# Patient Record
Sex: Female | Born: 1995 | Race: Black or African American | Hispanic: No | Marital: Single | Smoking: Never smoker
Health system: Southern US, Community
[De-identification: ages and names within clinical notes are randomized; demographics above are authoritative.]

## PROBLEM LIST (undated history)

## (undated) ENCOUNTER — Inpatient Hospital Stay (HOSPITAL_COMMUNITY): Payer: Self-pay

## (undated) ENCOUNTER — Inpatient Hospital Stay: Payer: Self-pay

## (undated) DIAGNOSIS — B951 Streptococcus, group B, as the cause of diseases classified elsewhere: Secondary | ICD-10-CM

## (undated) DIAGNOSIS — O139 Gestational [pregnancy-induced] hypertension without significant proteinuria, unspecified trimester: Secondary | ICD-10-CM

## (undated) HISTORY — PX: NO PAST SURGERIES: SHX2092

---
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
## 2013-03-01 DIAGNOSIS — O139 Gestational [pregnancy-induced] hypertension without significant proteinuria, unspecified trimester: Secondary | ICD-10-CM

## 2013-03-01 HISTORY — DX: Gestational (pregnancy-induced) hypertension without significant proteinuria, unspecified trimester: O13.9

## 2013-06-25 ENCOUNTER — Emergency Department (HOSPITAL_COMMUNITY)
Admission: EM | Admit: 2013-06-25 | Discharge: 2013-06-25 | Disposition: A | Discharge status: Home / Self Care | Payer: Self-pay | Attending: Emergency Medicine | Admitting: Emergency Medicine

## 2013-06-25 ENCOUNTER — Encounter (HOSPITAL_COMMUNITY): Payer: Self-pay | Admitting: Emergency Medicine

## 2013-06-25 DIAGNOSIS — Z3490 Encounter for supervision of normal pregnancy, unspecified, unspecified trimester: Secondary | ICD-10-CM

## 2013-06-25 DIAGNOSIS — O9989 Other specified diseases and conditions complicating pregnancy, childbirth and the puerperium: Secondary | ICD-10-CM | POA: Insufficient documentation

## 2013-06-25 DIAGNOSIS — M545 Low back pain, unspecified: Secondary | ICD-10-CM | POA: Insufficient documentation

## 2013-06-25 LAB — POC URINE PREG, ED: Preg Test, Ur: POSITIVE — AB

## 2013-06-25 MED ORDER — PRENATAL COMPLETE 14-0.4 MG PO TABS: 1.0000 | ORAL_TABLET | Freq: Every day | ORAL | Status: DC

## 2013-06-25 NOTE — ED Notes (Addendum)
pts and family members laughing and joking loudly, I told patient and family I would close the door so that they could continue speaking without interrupting other patients. Family opened the door right after and continued.  Told family " I do not mean to be rude but that I can hear them laughing and joking from my desk, and if it was possible to close the door or keep it down."  Pts mother told me "Sorry we are phobic, and that we are waiting on paper work and then we will leave, so i could chart that."

## 2013-06-25 NOTE — Discharge Instructions (Signed)
Take prenatal vitamins daily. Make an appointment at the recommended OBGYN clinic. Refer to attached documents for more information. Go to [facility] with worsening or concerning symptoms.

## 2013-06-25 NOTE — ED Provider Notes (Signed)
Medical screening examination/treatment/procedure(s) were performed by non-physician practitioner and as supervising physician I was immediately available for consultation/collaboration.   EKG Interpretation None       Juliet RudeNathan R. Rubin PayorPickering, MD 06/25/13 1540

## 2013-06-25 NOTE — ED Provider Notes (Signed)
CSN: [id number]     Arrival date & time 06/25/13  [id number] History   First MD Initiated Contact with Patient 06/25/13 [phone]     Chief Complaint  Patient presents with  . Back Pain     (Consider location/radiation/quality/duration/timing/severity/associated sxs/prior Treatment) HPI Comments: Patient is a 18 year old female who presents with gradual onset of lower back pain that started 1 month ago. The pain is aching and moderate and does not radiate. The pain is constant. Movement makes the pain worse. Nothing makes the pain better. Patient has not tried anything for pain. No associated symptoms. No saddles paresthesias or bladder/bowel incontinence. Patient reports LMP was 3 months ago. She is sexually active and does not use protection.     Patient is a 18 y.o. female presenting with back pain.  Back Pain Associated symptoms: no abdominal pain, no chest pain, no dysuria, no fever and no weakness     History reviewed. No pertinent past medical history. History reviewed. No pertinent past surgical history. No family history on file. History  Substance Use Topics  . Smoking status: Never Smoker   . Smokeless tobacco: Not on file  . Alcohol Use: No   OB History   Grav Para Term Preterm Abortions TAB SAB Ect Mult Living   1              Review of Systems  Constitutional: Negative for fever, chills and fatigue.  HENT: Negative for trouble swallowing.   Eyes: Negative for visual disturbance.  Respiratory: Negative for shortness of breath.   Cardiovascular: Negative for chest pain and palpitations.  Gastrointestinal: Negative for nausea, vomiting, abdominal pain and diarrhea.  Genitourinary: Negative for dysuria and difficulty urinating.  Musculoskeletal: Positive for back pain. Negative for arthralgias and neck pain.  Skin: Negative for color change.  Neurological: Negative for dizziness and weakness.  Psychiatric/Behavioral: Negative for dysphoric mood.      Allergies  Review  of patient's allergies indicates not on file.  Home Medications   Prior to Admission medications   Not on File   BP 131/81  Pulse 84  Temp(Src) 99 F (37.2 C) (Oral)  Resp 16  SpO2 100%  LMP 04/27/2013 Physical Exam  Nursing note and vitals reviewed. Constitutional: She is oriented to person, place, and time. She appears well-developed and well-nourished. No distress.  HENT:  Head: Normocephalic and atraumatic.  Eyes: Conjunctivae and EOM are normal.  Neck: Normal range of motion.  Cardiovascular: Normal rate and regular rhythm.  Exam reveals no gallop and no friction rub.   No murmur heard. Pulmonary/Chest: Effort normal and breath sounds normal. She has no wheezes. She has no rales. She exhibits no tenderness.  Abdominal: Soft. She exhibits no distension. There is no tenderness. There is no rebound.  Musculoskeletal: Normal range of motion.  Neurological: She is alert and oriented to person, place, and time. Coordination normal.  Speech is goal-oriented. Moves limbs without ataxia.   Skin: Skin is warm and dry.  Psychiatric: She has a normal mood and affect. Her behavior is normal.    ED Course  Procedures (including critical care time) Labs Review Labs Reviewed  POC URINE PREG, ED - Abnormal; Notable for the following:    Preg Test, Ur POSITIVE (*)    All other components within normal limits    Imaging Review No results found.   EKG Interpretation None      MDM   Final diagnoses:  Pregnancy    10:45 AM Patient's  urine pregnancy test is positive. Patient likely having musculoskeletal back pain. No injury. Symptoms have been going on for 1 month. Patient has no urinary complaints. Vitals stable and patient afebrile. No vaginal bleeding and no abdominal pain. Patient will be discharged with prenatal vitamins and instructions to follow up with an obgyn. Patient is ambulating without difficulty. She is laughing and talkative in the room. She appears non toxic. I  am not concerned about an ectopic pregnancy at this time. Patient [facility] with worsening or concerning symptoms.     Emilia BeckKaitlyn Terelle Dobler, PA-C 06/25/13 1054

## 2013-06-25 NOTE — Progress Notes (Signed)
[facility] provided pt with a list of self-pay PCP to help patient establish primary care.  °

## 2013-06-25 NOTE — ED Notes (Addendum)
Per pt, states lower back pain for about a month-no dysuria-states she thinks she is pregnant-took preg test and it said it was positive-no prenatal care

## 2013-08-17 ENCOUNTER — Ambulatory Visit: Payer: Self-pay | Admitting: Advanced Practice Midwife

## 2013-08-19 ENCOUNTER — Emergency Department: Payer: Self-pay | Admitting: Emergency Medicine

## 2013-08-19 LAB — URINALYSIS, COMPLETE
BILIRUBIN, UR: NEGATIVE
Blood: NEGATIVE
Glucose,UR: NEGATIVE mg/dL (ref 0–75)
Nitrite: NEGATIVE
Ph: 6 (ref 4.5–8.0)
Protein: 30
RBC,UR: 3 /HPF (ref 0–5)
Specific Gravity: 1.023 (ref 1.003–1.030)
Squamous Epithelial: 28

## 2013-08-19 LAB — BASIC METABOLIC PANEL
Anion Gap: 10 (ref 7–16)
BUN: 8 mg/dL (ref 7–18)
CALCIUM: 9.3 mg/dL (ref 8.5–10.1)
CO2: 23 mmol/L (ref 21–32)
CREATININE: 0.7 mg/dL (ref 0.60–1.30)
Chloride: 101 mmol/L (ref 98–107)
EGFR (African American): >60
Glucose: 67 mg/dL (ref 65–99)
OSMOLALITY: 265 (ref 275–301)
POTASSIUM: 3.3 mmol/L — AB (ref 3.5–5.1)
Sodium: 134 mmol/L — ABNORMAL LOW (ref 136–145)

## 2013-08-19 LAB — CBC WITH DIFFERENTIAL/PLATELET
Comment - H1-Com1: NORMAL
Comment - H1-Com2: NORMAL
HCT: 33.2 % — AB (ref 35.0–47.0)
HGB: 10.9 g/dL — AB (ref 12.0–16.0)
LYMPHS PCT: 17 %
MCH: 28 pg (ref 26.0–34.0)
MCHC: 32.7 g/dL (ref 32.0–36.0)
MCV: 85 fL (ref 80–100)
MONOS PCT: 7 %
Platelet: 321 10*3/uL (ref 150–440)
RBC: 3.88 10*6/uL (ref 3.80–5.20)
RDW: 14.1 % (ref 11.5–14.5)
Segmented Neutrophils: 76 %
WBC: 24.2 10*3/uL — ABNORMAL HIGH (ref 3.6–11.0)

## 2013-08-19 LAB — GC/CHLAMYDIA PROBE AMP

## 2013-08-19 LAB — WET PREP, GENITAL

## 2013-08-22 LAB — BETA STREP CULTURE(ARMC)

## 2013-08-24 LAB — CULTURE, BLOOD (SINGLE)

## 2013-08-27 ENCOUNTER — Encounter: Payer: Self-pay | Admitting: Maternal & Fetal Medicine

## 2013-12-31 ENCOUNTER — Encounter (HOSPITAL_COMMUNITY): Payer: Self-pay | Admitting: Emergency Medicine

## 2014-01-02 ENCOUNTER — Inpatient Hospital Stay: Payer: Self-pay | Admitting: Obstetrics and Gynecology

## 2014-01-02 LAB — PIH PROFILE
Anion Gap: 8 (ref 7–16)
BUN: 6 mg/dL — ABNORMAL LOW (ref 7–18)
Calcium, Total: 8.5 mg/dL (ref 8.5–10.1)
Chloride: 106 mmol/L (ref 98–107)
Co2: 27 mmol/L (ref 21–32)
Creatinine: 0.61 mg/dL (ref 0.60–1.30)
EGFR (African American): >60
EGFR (Non-African Amer.): >60
Glucose: 79 mg/dL (ref 65–99)
HCT: 30.7 % — ABNORMAL LOW (ref 35.0–47.0)
HGB: 10.3 g/dL — ABNORMAL LOW (ref 12.0–16.0)
MCH: 28.8 pg (ref 26.0–34.0)
MCHC: 33.7 g/dL (ref 32.0–36.0)
MCV: 85 fL (ref 80–100)
Osmolality: 278 (ref 275–301)
Platelet: 364 10*3/uL (ref 150–440)
Potassium: 3.2 mmol/L — ABNORMAL LOW (ref 3.5–5.1)
RBC: 3.59 10*6/uL — ABNORMAL LOW (ref 3.80–5.20)
RDW: 13.6 % (ref 11.5–14.5)
SGOT(AST): 23 U/L (ref 15–37)
Sodium: 141 mmol/L (ref 136–145)
Uric Acid: 3.3 mg/dL (ref 2.6–6.0)
WBC: 14.2 10*3/uL — ABNORMAL HIGH (ref 3.6–11.0)

## 2014-01-02 LAB — DRUG SCREEN, URINE

## 2014-01-02 LAB — PROTEIN / CREATININE RATIO, URINE
Creatinine, Urine: 150.4 mg/dL — ABNORMAL HIGH (ref 30.0–125.0)
Protein, Random Urine: 32 mg/dL — ABNORMAL HIGH (ref 0–12)

## 2014-01-02 LAB — PLATELET COUNT: Platelet: 325 10*3/uL (ref 150–440)

## 2014-01-04 LAB — HEMATOCRIT: HCT: 22.9 % — AB (ref 35.0–47.0)

## 2014-07-09 NOTE — H&P (Signed)
L&D Evaluation:  History:  HPI 19 yo G1 at 29w5dby 19wk [place] 01/11/14 presenting via EMS for vaginal spotting.  Passed quarter size clot at home, no inciting event such as intercourse, rrecent cervical checks, or abdominal trauma.  Was last seen at [facility] on 12/31/13.  Placentation anterior on prior ultrasound without concern for previa.  She was noted to be hypertensive on presentation without setting of prior hypertension.  She denies HA. vision changes, RUQ or epigastric pain, increased edema, or LOF.  She reports intermitten contractions, not particularly painfull some occasional tightening.  +FM.  PNC at [facility] noteable for late entry to care.  She had a prior negative UDS on 08/14/13 (prior report of marijuana use).  There is questionable agenesis or the right kidney and echogenic focus in the left ventricle noted on initial anatomy ultrasound but follow up ultrasound by DP was normal.   Presents with vaginal bleeding   Patient's Medical History Depression   Patient's Surgical History none   Medications Pre Natal Vitamins   Allergies NKDA, strawberries   Social History none   Family History Non-Contributory   ROS:  ROS All systems were reviewed.  HEENT, CNS, GI, GU, Respiratory, CV, Renal and Musculoskeletal systems were found to be normal.   Exam:  Vital Signs BP >140/90  143/100, 154/115, 145/67   Urine Protein P/C ratio pending   General no apparent distress   Mental Status clear   Chest clear   Abdomen gravid, non-tender   Estimated Fetal Weight Average for gestational age   Edema no edema   Pelvic no external lesions, 2.5/70/-3 minimal bright red blood on glove with check   Mebranes Intact   FHT normal rate with no decels, 145, moderate, +accels, no decels   Ucx irregular   Skin dry, no lesions   Lymph no lymphadenopathy   Impression:  Impression 19yo G1 at 362w5dith elevated BP, initial complaint spottiing   Plan:  Plan EFM/NST,  monitor contractions and for cervical change, monitor BP, PIH panel   Comments 1) Spotting - no abdominal pain, reassuring fetal surveillance low clinical concern for abruption.  Will monitor, may be secondary to cervical change as she is contracting irregularly.  Rh positive  2) Fetus - category I tracing  3) O pos / ABSC neg / RI by MMR x 2 / HBsAg neg / HIV neg VZNI / Hgb AA / no report of 1-hr / GBS bacteruria this pregnancy - varivax postpartum  4) Will admit for IOL for GHTN vs PreE at term   Electronic Signatures for Addendum Section:  StDorthula NettlesMD) (Signed Addendum [id number])  P/C ratio is 212, remainder of PIH labs are normal.  BP remain mild range.  Will plan on admission and cytotec induction for gestational hypertension.  At present no neuroligc symptoms, negative protein, and mild range BPs.  Will monitor for severe range BP or developemnt of neuroligc symptoms   Electronic Signatures: StDorthula NettlesMD)  (Signed [id number])  Authored: L&D Evaluation   Last Updated: 04-Nov-15 06:19 by StDorthula NettlesMD)

## 2014-09-26 ENCOUNTER — Other Ambulatory Visit: Payer: Self-pay | Admitting: Physician Assistant

## 2014-09-26 DIAGNOSIS — Z369 Encounter for antenatal screening, unspecified: Secondary | ICD-10-CM

## 2014-09-27 LAB — OB RESULTS CONSOLE RPR: RPR: NONREACTIVE

## 2014-09-27 LAB — OB RESULTS CONSOLE ABO/RH: RH Type: POSITIVE

## 2014-09-27 LAB — OB RESULTS CONSOLE HEPATITIS B SURFACE ANTIGEN: Hepatitis B Surface Ag: NEGATIVE

## 2014-09-27 LAB — OB RESULTS CONSOLE HIV ANTIBODY (ROUTINE TESTING): HIV: NONREACTIVE

## 2014-09-27 LAB — OB RESULTS CONSOLE ANTIBODY SCREEN: ANTIBODY SCREEN: NEGATIVE

## 2014-09-29 LAB — OB RESULTS CONSOLE GC/CHLAMYDIA: Gonorrhea: NEGATIVE

## 2014-10-01 LAB — OB RESULTS CONSOLE GC/CHLAMYDIA: Chlamydia: POSITIVE

## 2014-10-17 ENCOUNTER — Ambulatory Visit: Admission: RE | Admit: 2014-10-17 | Payer: Self-pay | Source: Ambulatory Visit

## 2014-10-24 ENCOUNTER — Ambulatory Visit
Admission: RE | Admit: 2014-10-24 | Discharge: 2014-10-24 | Disposition: A | Discharge status: Home / Self Care | Payer: Self-pay | Source: Ambulatory Visit | Attending: Obstetrics and Gynecology | Admitting: Obstetrics and Gynecology

## 2014-10-24 ENCOUNTER — Ambulatory Visit
Admission: RE | Admit: 2014-10-24 | Discharge: 2014-10-24 | Disposition: A | Discharge status: Home / Self Care | Payer: Self-pay | Source: Ambulatory Visit | Attending: Physician Assistant | Admitting: Physician Assistant

## 2014-10-24 VITALS — BP 116/74 | HR 92 | Temp 98.5°F | Ht 61.0 in | Wt 147.6 lb

## 2014-10-24 DIAGNOSIS — Z369 Encounter for antenatal screening, unspecified: Secondary | ICD-10-CM

## 2014-10-24 DIAGNOSIS — Z1379 Encounter for other screening for genetic and chromosomal anomalies: Secondary | ICD-10-CM | POA: Insufficient documentation

## 2014-10-24 DIAGNOSIS — Z3A15 15 weeks gestation of pregnancy: Secondary | ICD-10-CM

## 2014-10-24 DIAGNOSIS — Z1389 Encounter for screening for other disorder: Secondary | ICD-10-CM

## 2014-10-24 DIAGNOSIS — Z36 Encounter for antenatal screening of mother: Secondary | ICD-10-CM | POA: Insufficient documentation

## 2014-10-24 HISTORY — DX: Gestational (pregnancy-induced) hypertension without significant proteinuria, unspecified trimester: O13.9

## 2014-10-24 NOTE — Progress Notes (Addendum)
Referring physician:  [facility] Department Length of Consultation: 30 minutes   Colleen Shaw  was referred to [facility] for genetic counseling to review prenatal screening and testing options.  This note summarizes the information we discussed.    The following routine screening tests are available in this pregnancy:  First trimester screening, which can include nuchal translucency ultrasound screen and/or first trimester maternal serum marker screening.  The nuchal translucency has approximately an 80% detection rate for Down syndrome and can be positive for other chromosome abnormalities as well as congenital heart defects.  When combined with a maternal serum marker screening, the detection rate is up to 90% for Down syndrome and up to 97% for trisomy 18.     Maternal serum marker screening, a blood test that measures pregnancy proteins, can provide risk assessments for Down syndrome, trisomy 18, and open neural tube defects (spina bifida, anencephaly). Because it does not directly examine the fetus, it cannot positively diagnose or rule out these problems.  Targeted ultrasound uses high frequency sound waves to create an image of the developing fetus.  An ultrasound is often recommended as a routine means of evaluating the pregnancy.  It is also used to screen for fetal anatomy problems (for example, a heart defect) that might be suggestive of a chromosomal or other abnormality.   Should these screening tests indicate an increased concern, then the following additional testing options would be offered:  The chorionic villus sampling procedure is available for first trimester chromosome analysis.  This involves the withdrawal of a small amount of chorionic villi (tissue from the developing placenta).  Risk of pregnancy loss is estimated to be approximately 1 in 200 to 1 in 100 (0.5 to 1%).  There is approximately a 1% (1 in 100) chance that the CVS chromosome  results will be unclear.  Chorionic villi cannot be tested for neural tube defects.     Amniocentesis involves the removal of a small amount of amniotic fluid from the sac surrounding the fetus with the use of a thin needle inserted through the maternal abdomen and uterus.  Ultrasound guidance is used throughout the procedure.  Fetal cells from amniotic fluid are directly evaluated and > 99.5% of chromosome problems and > 98% of open neural tube defects can be detected. This procedure is generally performed after the 15th week of pregnancy.  The main risks to this procedure include complications leading to miscarriage in less than 1 in 200 cases (0.5%).  As another option for information if the pregnancy is suspected to be an an increased chance for certain chromosome conditions, we also reviewed the availability of cell free fetal DNA testing from maternal blood to determine whether or not the baby may have either Down syndrome, trisomy 74, or trisomy 70.  This test utilizes a maternal blood sample and DNA sequencing technology to isolate circulating cell free fetal DNA from maternal plasma.  The fetal DNA can then be analyzed for DNA sequences that are derived from the three most common chromosomes involved in aneuploidy, chromosomes 13, 18, and 21.  If the overall amount of DNA is greater than the expected level for any of these chromosomes, aneuploidy is suspected.  While we do not consider it a replacement for invasive testing and karyotype analysis, a negative result from this testing would be reassuring, though not a guarantee of a normal chromosome complement for the baby.  An abnormal result is certainly suggestive of an abnormal chromosome  complement, though we would still recommend CVS or amniocentesis to confirm any findings from this testing.  We obtained a detailed family history and pregnancy history.  The remainder of the family history was reported to be unremarkable for birth defects, mental  retardation, recurrent pregnancy loss or known chromosome abnormalities.  Colleen Shaw stated that this is her second pregnancy. She and her partner have a healthy 27 month old daughter.  She reported no complications or exposures in the current pregnancy that would be expected to increase the risk for birth defects.  After consideration of the options, Colleen Shaw elected to proceed with first trimester screening.  An ultrasound was performed at the time of the visit. Fetal anatomy could not be assessed due to early gestational age.  Please refer to the ultrasound report for details of that study including gestational age and NT measurement.  Colleen Shaw was encouraged to call with questions or concerns.  We can be contacted at [phone].    I saw the pt and agree with the assessment in the genetic counselors note  Brantley Fling MD   Addendum:  Ultrasound revealed the patient to be [redacted] weeks gestation.  Therefore, she was too far along for the first trimester screening to be performed.  She desired to have maternal serum screening drawn at her next OB visit, as documented on the ultrasound report.

## 2014-10-31 NOTE — Addendum Note (Signed)
Encounter addended by: Katrina Stack on: 10/31/2014 12:05 PM<BR>     Documentation filed: Notes Section

## 2014-11-13 ENCOUNTER — Other Ambulatory Visit: Payer: Self-pay

## 2014-11-13 DIAGNOSIS — O359XX0 Maternal care for (suspected) fetal abnormality and damage, unspecified, not applicable or unspecified: Secondary | ICD-10-CM

## 2014-11-14 ENCOUNTER — Ambulatory Visit
Admission: RE | Admit: 2014-11-14 | Discharge: 2014-11-14 | Disposition: A | Discharge status: Home / Self Care | Payer: Self-pay | Source: Ambulatory Visit | Attending: Obstetrics & Gynecology | Admitting: Obstetrics & Gynecology

## 2014-11-14 VITALS — BP 118/67 | HR 82 | Temp 98.4°F | Resp 18 | Ht 61.2 in | Wt 148.0 lb

## 2014-11-14 DIAGNOSIS — O359XX0 Maternal care for (suspected) fetal abnormality and damage, unspecified, not applicable or unspecified: Secondary | ICD-10-CM

## 2014-11-14 DIAGNOSIS — Z3A18 18 weeks gestation of pregnancy: Secondary | ICD-10-CM | POA: Insufficient documentation

## 2014-11-14 DIAGNOSIS — O283 Abnormal ultrasonic finding on antenatal screening of mother: Secondary | ICD-10-CM | POA: Insufficient documentation

## 2014-11-14 NOTE — Progress Notes (Addendum)
[facility]: Pt presented for anatomy scan. She had to leave before we could complete the study or review the results. There is an EIF present.  I called her to review on the phone.  She declines cell-free fetal DNA testing or amniocentesis at this time but would like to proceed with multiple marker testing with the [facility]  -Pt will call the [facility] tomorrow to schedule multiple marker testing. -Return in four weeks for [place] to complete anatomy  Har[place] Paster, [place] A, MD

## 2014-12-02 ENCOUNTER — Ambulatory Visit: Admission: RE | Admit: 2014-12-02 | Payer: Medicaid Other | Source: Ambulatory Visit

## 2015-03-02 NOTE — L&D Delivery Note (Signed)
Delivery Note At 3:50 PM a viable female was delivered via Vaginal, Spontaneous Delivery (Presentation: Left Occiput Anterior).  APGAR: 9, 9; weight 6 lb 1 oz (2750 g).   Placenta status: Intact, Spontaneous.  Cord: 3 vessels with the following complications: None.  Cord pH: NA  Anesthesia: Epidural  Episiotomy: None Lacerations: None Suture Repair: NA Est. Blood Loss (mL): 100  Mom to postpartum.  Baby to Couplet care / Skin to Skin. Placenta to: BS Feeding: Bottle Circ: NA Contraception: IUD   [place], Koleen Nimrod 04/12/2015, 5:08 PM

## 2015-03-24 ENCOUNTER — Encounter: Payer: Self-pay | Admitting: Family Medicine

## 2015-03-24 ENCOUNTER — Encounter: Payer: Medicaid Other | Admitting: Family Medicine

## 2015-04-10 ENCOUNTER — Encounter: Payer: Self-pay | Admitting: Obstetrics and Gynecology

## 2015-04-12 ENCOUNTER — Inpatient Hospital Stay (HOSPITAL_COMMUNITY): Payer: Medicaid Other | Admitting: Anesthesiology

## 2015-04-12 ENCOUNTER — Inpatient Hospital Stay (HOSPITAL_COMMUNITY)
Admission: AD | Admit: 2015-04-12 | Discharge: 2015-04-14 | DRG: 774 | Disposition: A | Discharge status: Home / Self Care | Payer: Medicaid Other | Source: Ambulatory Visit | Attending: Family Medicine | Admitting: Family Medicine

## 2015-04-12 ENCOUNTER — Encounter (HOSPITAL_COMMUNITY): Payer: Self-pay

## 2015-04-12 DIAGNOSIS — F329 Major depressive disorder, single episode, unspecified: Secondary | ICD-10-CM | POA: Diagnosis present

## 2015-04-12 DIAGNOSIS — IMO0001 Reserved for inherently not codable concepts without codable children: Secondary | ICD-10-CM

## 2015-04-12 DIAGNOSIS — O283 Abnormal ultrasonic finding on antenatal screening of mother: Secondary | ICD-10-CM

## 2015-04-12 DIAGNOSIS — O1414 Severe pre-eclampsia complicating childbirth: Secondary | ICD-10-CM | POA: Diagnosis present

## 2015-04-12 DIAGNOSIS — O99344 Other mental disorders complicating childbirth: Secondary | ICD-10-CM | POA: Diagnosis present

## 2015-04-12 DIAGNOSIS — Z3A39 39 weeks gestation of pregnancy: Secondary | ICD-10-CM

## 2015-04-12 DIAGNOSIS — A749 Chlamydial infection, unspecified: Secondary | ICD-10-CM | POA: Clinically undetermined

## 2015-04-12 DIAGNOSIS — O9882 Other maternal infectious and parasitic diseases complicating childbirth: Secondary | ICD-10-CM | POA: Diagnosis present

## 2015-04-12 DIAGNOSIS — O09299 Supervision of pregnancy with other poor reproductive or obstetric history, unspecified trimester: Secondary | ICD-10-CM

## 2015-04-12 DIAGNOSIS — O98819 Other maternal infectious and parasitic diseases complicating pregnancy, unspecified trimester: Secondary | ICD-10-CM

## 2015-04-12 DIAGNOSIS — O1494 Unspecified pre-eclampsia, complicating childbirth: Secondary | ICD-10-CM | POA: Diagnosis present

## 2015-04-12 LAB — COMPREHENSIVE METABOLIC PANEL
ALBUMIN: 3 g/dL — AB (ref 3.5–5.0)
ALK PHOS: 180 U/L — AB (ref 38–126)
ALT: 5 U/L — ABNORMAL LOW (ref 14–54)
AST: 23 U/L (ref 15–41)
Anion gap: 8 (ref 5–15)
BILIRUBIN TOTAL: 0.3 mg/dL (ref 0.3–1.2)
BUN: 8 mg/dL (ref 6–20)
CALCIUM: 8.6 mg/dL — AB (ref 8.9–10.3)
CO2: 22 mmol/L (ref 22–32)
CREATININE: 0.52 mg/dL (ref 0.44–1.00)
Chloride: 107 mmol/L (ref 101–111)
GFR calc Af Amer: >60 mL/min (ref >60)
GLUCOSE: 95 mg/dL (ref 65–99)
POTASSIUM: 3.7 mmol/L (ref 3.5–5.1)
Sodium: 137 mmol/L (ref 135–145)
TOTAL PROTEIN: 6.7 g/dL (ref 6.5–8.1)

## 2015-04-12 LAB — PROTEIN / CREATININE RATIO, URINE
Creatinine, Urine: 53 mg/dL
PROTEIN CREATININE RATIO: 0.25 mg/mg{creat} — AB (ref 0.00–0.15)
Total Protein, Urine: 13 mg/dL

## 2015-04-12 LAB — URINALYSIS, ROUTINE W REFLEX MICROSCOPIC
Bilirubin Urine: NEGATIVE
Glucose, UA: NEGATIVE mg/dL
Ketones, ur: NEGATIVE mg/dL
LEUKOCYTES UA: NEGATIVE
NITRITE: NEGATIVE
PROTEIN: NEGATIVE mg/dL
SPECIFIC GRAVITY, URINE: 1.02 (ref 1.005–1.030)
pH: 8 (ref 5.0–8.0)

## 2015-04-12 LAB — CBC
HEMATOCRIT: 27.7 % — AB (ref 36.0–46.0)
HEMATOCRIT: 27.2 % — AB (ref 36.0–46.0)
HEMOGLOBIN: 8.9 g/dL — AB (ref 12.0–15.0)
Hemoglobin: 8.9 g/dL — ABNORMAL LOW (ref 12.0–15.0)
MCH: 25.5 pg — ABNORMAL LOW (ref 26.0–34.0)
MCH: 25.9 pg — AB (ref 26.0–34.0)
MCHC: 32.1 g/dL (ref 30.0–36.0)
MCHC: 32.7 g/dL (ref 30.0–36.0)
MCV: 79.4 fL (ref 78.0–100.0)
MCV: 79.3 fL (ref 78.0–100.0)
Platelets: 427 10*3/uL — ABNORMAL HIGH (ref 150–400)
Platelets: 406 10*3/uL — ABNORMAL HIGH (ref 150–400)
RBC: 3.49 MIL/uL — ABNORMAL LOW (ref 3.87–5.11)
RBC: 3.43 MIL/uL — ABNORMAL LOW (ref 3.87–5.11)
RDW: 14.6 % (ref 11.5–15.5)
RDW: 14.7 % (ref 11.5–15.5)
WBC: 14.1 10*3/uL — AB (ref 4.0–10.5)
WBC: 24.9 10*3/uL — AB (ref 4.0–10.5)

## 2015-04-12 LAB — RAPID URINE DRUG SCREEN, HOSP PERFORMED
AMPHETAMINES: NOT DETECTED
BENZODIAZEPINES: NOT DETECTED
Barbiturates: NOT DETECTED
COCAINE: NOT DETECTED
OPIATES: NOT DETECTED
Tetrahydrocannabinol: NOT DETECTED

## 2015-04-12 LAB — URINE MICROSCOPIC-ADD ON

## 2015-04-12 LAB — TYPE AND SCREEN
ABO/RH(D): O POS
Antibody Screen: NEGATIVE

## 2015-04-12 LAB — ABO/RH: ABO/RH(D): O POS

## 2015-04-12 LAB — GROUP B STREP BY PCR: GROUP B STREP BY PCR: POSITIVE — AB

## 2015-04-12 LAB — OB RESULTS CONSOLE GBS: STREP GROUP B AG: POSITIVE

## 2015-04-12 MED ORDER — EPHEDRINE 5 MG/ML INJ: 10.0000 mg | INTRAVENOUS | Status: DC | PRN

## 2015-04-12 MED ORDER — PHENYLEPHRINE 40 MCG/ML (10ML) SYRINGE FOR IV PUSH (FOR BLOOD PRESSURE SUPPORT): 80.0000 ug | PREFILLED_SYRINGE | INTRAVENOUS | Status: DC | PRN

## 2015-04-12 MED ORDER — CITRIC ACID-SODIUM CITRATE 334-500 MG/5ML PO SOLN: 30.0000 mL | ORAL | Status: DC | PRN

## 2015-04-12 MED ORDER — NIFEDIPINE ER OSMOTIC RELEASE 30 MG PO TB24
30.0000 mg | ORAL_TABLET | Freq: Two times a day (BID) | ORAL | Status: DC
  Administered 2015-04-12: 30 mg via ORAL
  Filled 2015-04-12 (×3): qty 1

## 2015-04-12 MED ORDER — MAGNESIUM SULFATE 50 % IJ SOLN
2.0000 g/h | INTRAVENOUS | Status: AC
  Administered 2015-04-12: 4 g/h via INTRAVENOUS
  Filled 2015-04-12 (×2): qty 80

## 2015-04-12 MED ORDER — OXYTOCIN 10 UNIT/ML IJ SOLN
2.5000 [IU]/h | INTRAVENOUS | Status: DC
  Administered 2015-04-12: 16:00:00 via INTRAVENOUS
  Filled 2015-04-12: qty 4

## 2015-04-12 MED ORDER — PENICILLIN G POTASSIUM 5000000 UNITS IJ SOLR
2.5000 10*6.[IU] | INTRAVENOUS | Status: DC
  Administered 2015-04-12: 2.5 10*6.[IU] via INTRAVENOUS
  Filled 2015-04-12 (×5): qty 2.5

## 2015-04-12 MED ORDER — LACTATED RINGERS IV SOLN: 500.0000 mL | Freq: Once | INTRAVENOUS | Status: DC

## 2015-04-12 MED ORDER — BENZOCAINE-MENTHOL 20-0.5 % EX AERO: 1.0000 "application " | INHALATION_SPRAY | CUTANEOUS | Status: DC | PRN

## 2015-04-12 MED ORDER — LIDOCAINE HCL (PF) 1 % IJ SOLN
INTRAMUSCULAR | Status: DC | PRN
  Administered 2015-04-12 (×2): 4 mL via EPIDURAL

## 2015-04-12 MED ORDER — EPHEDRINE 5 MG/ML INJ
10.0000 mg | INTRAVENOUS | Status: DC | PRN
Start: 2015-04-12 — End: 2015-04-12
  Filled 2015-04-12: qty 2

## 2015-04-12 MED ORDER — LABETALOL HCL 5 MG/ML IV SOLN
20.0000 mg | INTRAVENOUS | Status: DC | PRN
  Administered 2015-04-12 (×2): 20 mg via INTRAVENOUS
  Filled 2015-04-12 (×3): qty 4

## 2015-04-12 MED ORDER — LACTATED RINGERS IV SOLN
500.0000 mL | INTRAVENOUS | Status: DC | PRN
  Administered 2015-04-12: 1000 mL via INTRAVENOUS

## 2015-04-12 MED ORDER — EPHEDRINE 5 MG/ML INJ
10.0000 mg | INTRAVENOUS | Status: DC | PRN
  Filled 2015-04-12: qty 2

## 2015-04-12 MED ORDER — DIBUCAINE 1 % RE OINT
1.0000 | TOPICAL_OINTMENT | RECTAL | Status: DC | PRN
Start: 2015-04-12 — End: 2015-04-14

## 2015-04-12 MED ORDER — PHENYLEPHRINE 40 MCG/ML (10ML) SYRINGE FOR IV PUSH (FOR BLOOD PRESSURE SUPPORT)
80.0000 ug | PREFILLED_SYRINGE | INTRAVENOUS | Status: DC | PRN
  Filled 2015-04-12: qty 2

## 2015-04-12 MED ORDER — HYDRALAZINE HCL 20 MG/ML IJ SOLN: 10.0000 mg | Freq: Once | INTRAMUSCULAR | Status: DC | PRN

## 2015-04-12 MED ORDER — FERROUS SULFATE 325 (65 FE) MG PO TABS
325.0000 mg | ORAL_TABLET | Freq: Two times a day (BID) | ORAL | Status: DC
  Administered 2015-04-13 – 2015-04-14 (×2): 325 mg via ORAL
  Filled 2015-04-12 (×3): qty 1

## 2015-04-12 MED ORDER — PHENYLEPHRINE 40 MCG/ML (10ML) SYRINGE FOR IV PUSH (FOR BLOOD PRESSURE SUPPORT)
80.0000 ug | PREFILLED_SYRINGE | INTRAVENOUS | Status: DC | PRN
Start: 2015-04-12 — End: 2015-04-12
  Filled 2015-04-12: qty 2
  Filled 2015-04-12: qty 20

## 2015-04-12 MED ORDER — ZOLPIDEM TARTRATE 5 MG PO TABS: 5.0000 mg | ORAL_TABLET | Freq: Every evening | ORAL | Status: DC | PRN

## 2015-04-12 MED ORDER — SENNOSIDES-DOCUSATE SODIUM 8.6-50 MG PO TABS
2.0000 | ORAL_TABLET | ORAL | Status: DC
  Administered 2015-04-12 – 2015-04-13 (×2): 2 via ORAL
  Filled 2015-04-12 (×2): qty 2

## 2015-04-12 MED ORDER — ONDANSETRON HCL 4 MG/2ML IJ SOLN
4.0000 mg | Freq: Four times a day (QID) | INTRAMUSCULAR | Status: DC | PRN
  Administered 2015-04-12: 4 mg via INTRAVENOUS
  Filled 2015-04-12: qty 2

## 2015-04-12 MED ORDER — FENTANYL CITRATE (PF) 100 MCG/2ML IJ SOLN
100.0000 ug | INTRAMUSCULAR | Status: DC | PRN
  Administered 2015-04-12 (×2): 100 ug via INTRAVENOUS
  Filled 2015-04-12 (×2): qty 2

## 2015-04-12 MED ORDER — OXYCODONE-ACETAMINOPHEN 5-325 MG PO TABS: 1.0000 | ORAL_TABLET | ORAL | Status: DC | PRN

## 2015-04-12 MED ORDER — DIPHENHYDRAMINE HCL 50 MG/ML IJ SOLN: 12.5000 mg | INTRAMUSCULAR | Status: DC | PRN

## 2015-04-12 MED ORDER — LANOLIN HYDROUS EX OINT
1.0000 | TOPICAL_OINTMENT | CUTANEOUS | Status: DC | PRN
Start: 2015-04-12 — End: 2015-04-14

## 2015-04-12 MED ORDER — TETANUS-DIPHTH-ACELL PERTUSSIS 5-2.5-18.5 LF-MCG/0.5 IM SUSP
0.5000 mL | Freq: Once | INTRAMUSCULAR | Status: AC
  Administered 2015-04-13: 0.5 mL via INTRAMUSCULAR
  Filled 2015-04-12 (×2): qty 0.5

## 2015-04-12 MED ORDER — LACTATED RINGERS IV SOLN
INTRAVENOUS | Status: DC
  Administered 2015-04-12 (×2): via INTRAVENOUS

## 2015-04-12 MED ORDER — OXYCODONE-ACETAMINOPHEN 5-325 MG PO TABS: 2.0000 | ORAL_TABLET | ORAL | Status: DC | PRN

## 2015-04-12 MED ORDER — FENTANYL 2.5 MCG/ML BUPIVACAINE 1/10 % EPIDURAL INFUSION (WH - ANES)
14.0000 mL/h | INTRAMUSCULAR | Status: DC | PRN
  Administered 2015-04-12: 14 mL/h via EPIDURAL
  Filled 2015-04-12: qty 125

## 2015-04-12 MED ORDER — OXYTOCIN BOLUS FROM INFUSION: 500.0000 mL | INTRAVENOUS | Status: DC

## 2015-04-12 MED ORDER — ONDANSETRON HCL 4 MG/2ML IJ SOLN: 4.0000 mg | INTRAMUSCULAR | Status: DC | PRN

## 2015-04-12 MED ORDER — LACTATED RINGERS IV SOLN
INTRAVENOUS | Status: DC
  Administered 2015-04-13: via INTRAVENOUS

## 2015-04-12 MED ORDER — MEASLES, MUMPS & RUBELLA VAC ~~LOC~~ INJ
0.5000 mL | INJECTION | Freq: Once | SUBCUTANEOUS | Status: AC
  Administered 2015-04-13: 0.5 mL via SUBCUTANEOUS
  Filled 2015-04-12 (×2): qty 0.5

## 2015-04-12 MED ORDER — DEXTROSE 5 % IV SOLN
5.0000 10*6.[IU] | Freq: Once | INTRAVENOUS | Status: AC
  Administered 2015-04-12: 5 10*6.[IU] via INTRAVENOUS
  Filled 2015-04-12: qty 5

## 2015-04-12 MED ORDER — FENTANYL 2.5 MCG/ML BUPIVACAINE 1/10 % EPIDURAL INFUSION (WH - ANES): 14.0000 mL/h | INTRAMUSCULAR | Status: DC | PRN

## 2015-04-12 MED ORDER — DIPHENHYDRAMINE HCL 25 MG PO CAPS: 25.0000 mg | ORAL_CAPSULE | Freq: Four times a day (QID) | ORAL | Status: DC | PRN

## 2015-04-12 MED ORDER — LIDOCAINE HCL (PF) 1 % IJ SOLN
30.0000 mL | INTRAMUSCULAR | Status: DC | PRN
  Filled 2015-04-12: qty 30

## 2015-04-12 MED ORDER — ACETAMINOPHEN 325 MG PO TABS: 650.0000 mg | ORAL_TABLET | ORAL | Status: DC | PRN

## 2015-04-12 MED ORDER — MISOPROSTOL 200 MCG PO TABS
800.0000 ug | ORAL_TABLET | Freq: Once | ORAL | Status: AC | PRN
  Administered 2015-04-12: 800 ug via ORAL
  Filled 2015-04-12: qty 4

## 2015-04-12 MED ORDER — MAGNESIUM HYDROXIDE 400 MG/5ML PO SUSP: 30.0000 mL | ORAL | Status: DC | PRN

## 2015-04-12 MED ORDER — SIMETHICONE 80 MG PO CHEW: 80.0000 mg | CHEWABLE_TABLET | ORAL | Status: DC | PRN

## 2015-04-12 MED ORDER — PRENATAL MULTIVITAMIN CH
1.0000 | ORAL_TABLET | Freq: Every day | ORAL | Status: DC
  Administered 2015-04-13 – 2015-04-14 (×2): 1 via ORAL
  Filled 2015-04-12 (×2): qty 1

## 2015-04-12 MED ORDER — ONDANSETRON HCL 4 MG PO TABS: 4.0000 mg | ORAL_TABLET | ORAL | Status: DC | PRN

## 2015-04-12 MED ORDER — WITCH HAZEL-GLYCERIN EX PADS: 1.0000 "application " | MEDICATED_PAD | CUTANEOUS | Status: DC | PRN

## 2015-04-12 MED ORDER — MAGNESIUM SULFATE BOLUS VIA INFUSION
4.0000 g | Freq: Once | INTRAVENOUS | Status: DC
  Filled 2015-04-12: qty 500

## 2015-04-12 MED ORDER — IBUPROFEN 600 MG PO TABS
600.0000 mg | ORAL_TABLET | Freq: Four times a day (QID) | ORAL | Status: DC
  Administered 2015-04-12 – 2015-04-14 (×7): 600 mg via ORAL
  Filled 2015-04-12 (×7): qty 1

## 2015-04-12 NOTE — MAU Note (Signed)
Pt presents complaining of contractions for 1 hour and some bloody show. Denies leaking. Reports good fetal movement. States [facility].

## 2015-04-12 NOTE — Progress Notes (Signed)
Notified of pt arrival in MAU and complaint of contractions. Also notified of increased BP. Will come see pt

## 2015-04-12 NOTE — Progress Notes (Signed)
Patient ID: Colleen Shaw, female   DOB: 1996/01/16, 20 y.o.   MRN: [id number] Colleen Shaw is a 20 y.o. G2P1001 at [redacted]w[redacted]d.  Subjective: Comfortable w/ epidural. Denies pelvic pressure.   Objective: BP 145/93 mmHg  Pulse 86  Temp(Src) 98.4 F (36.9 C) (Oral)  Resp 18  Ht 5' (1.524 m)  Wt 155 lb (70.308 kg)  BMI 30.27 kg/m2  LMP 06/03/2014 (Within Weeks)   FHT:  FHR: 125 bpm, variability: mod-marked,  accelerations:  15x15,  decelerations:  none UC:   Q 3-4 minutes, strong  Dilation: 9 Effacement (%): 100 Station: 0 Presentation: Vertex Exam by:: Lauren Mcdanielr N At [phone]   Repeat exam deferred.   Labs: Results for orders placed or performed during the hospital encounter of 04/12/15 (from the past 24 hour(s))  OB RESULT CONSOLE Group B Strep     Status: None   Collection Time: 04/12/15 12:00 AM  Result Value Ref Range   GBS Positive   Group B strep by PCR     Status: Abnormal   Collection Time: 04/12/15  5:35 AM  Result Value Ref Range   Group B strep by PCR POSITIVE (A) NEGATIVE  CBC     Status: Abnormal   Collection Time: 04/12/15  5:40 AM  Result Value Ref Range   WBC 14.1 (H) 4.0 - 10.5 K/uL   RBC 3.49 (L) 3.87 - 5.11 MIL/uL   Hemoglobin 8.9 (L) 12.0 - 15.0 g/dL   HCT 47.8 (L) 29.5 - 62.1 %   MCV 79.4 78.0 - 100.0 fL   MCH 25.5 (L) 26.0 - 34.0 pg   MCHC 32.1 30.0 - 36.0 g/dL   RDW 30.8 65.7 - 84.6 %   Platelets 427 (H) 150 - 400 K/uL  Comprehensive metabolic panel     Status: Abnormal   Collection Time: 04/12/15  5:40 AM  Result Value Ref Range   Sodium 137 135 - 145 mmol/L   Potassium 3.7 3.5 - 5.1 mmol/L   Chloride 107 101 - 111 mmol/L   CO2 22 22 - 32 mmol/L   Glucose, Bld 95 65 - 99 mg/dL   BUN 8 6 - 20 mg/dL   Creatinine, Ser 9.62 0.44 - 1.00 mg/dL   Calcium 8.6 (L) 8.9 - 10.3 mg/dL   Total Protein 6.7 6.5 - 8.1 g/dL   Albumin 3.0 (L) 3.5 - 5.0 g/dL   AST 23 15 - 41 U/L   ALT 5 (L) 14 - 54 U/L   Alkaline Phosphatase 180 (H) 38 - 126 U/L   Total  Bilirubin 0.3 0.3 - 1.2 mg/dL   GFR calc non Af Amer >60 >60 mL/min   GFR calc Af Amer >60 >60 mL/min   Anion gap 8 5 - 15  Type and screen [facility]     Status: None   Collection Time: 04/12/15  5:40 AM  Result Value Ref Range   ABO/RH(D) O POS    Antibody Screen NEG    Sample Expiration 04/15/2015   Protein / creatinine ratio, urine     Status: Abnormal   Collection Time: 04/12/15  7:55 AM  Result Value Ref Range   Creatinine, Urine 53.00 mg/dL   Total Protein, Urine 13 mg/dL   Protein Creatinine Ratio 0.25 (H) 0.00 - 0.15 mg/mg[Cre]  Urinalysis, Routine w reflex microscopic (not at [facility])     Status: Abnormal   Collection Time: 04/12/15  7:55 AM  Result Value Ref Range   Color, Urine YELLOW  YELLOW   APPearance CLEAR CLEAR   Specific Gravity, Urine 1.020 1.005 - 1.030   pH 8.0 5.0 - 8.0   Glucose, UA NEGATIVE NEGATIVE mg/dL   Hgb urine dipstick SMALL (A) NEGATIVE   Bilirubin Urine NEGATIVE NEGATIVE   Ketones, ur NEGATIVE NEGATIVE mg/dL   Protein, ur NEGATIVE NEGATIVE mg/dL   Nitrite NEGATIVE NEGATIVE   Leukocytes, UA NEGATIVE NEGATIVE  Urine rapid drug screen (hosp performed)not at [facility]     Status: None   Collection Time: 04/12/15  7:55 AM  Result Value Ref Range   Opiates NONE DETECTED NONE DETECTED   Cocaine NONE DETECTED NONE DETECTED   Benzodiazepines NONE DETECTED NONE DETECTED   Amphetamines NONE DETECTED NONE DETECTED   Tetrahydrocannabinol NONE DETECTED NONE DETECTED   Barbiturates NONE DETECTED NONE DETECTED  Urine microscopic-add on     Status: Abnormal   Collection Time: 04/12/15  7:55 AM  Result Value Ref Range   Squamous Epithelial / LPF 0-5 (A) NONE SEEN   WBC, UA 0-5 0 - 5 WBC/hpf   RBC / HPF 0-5 0 - 5 RBC/hpf   Bacteria, UA FEW (A) NONE SEEN    Assessment / Plan: [redacted]w[redacted]d week IUP Labor: transition Fetal Wellbeing:  Category I Pain Control:  Epidural Anticipated MOD:  SVD Will try to delay delivery to achieve adequate GBS  prophylaxis.   [place], [place] 04/12/2015 10:32 AM

## 2015-04-12 NOTE — Anesthesia Procedure Notes (Signed)
Epidural Patient location during procedure: OB Start time: 04/12/2015 9:16 AM End time: 04/12/2015 9:19 AM  Staffing Anesthesiologist: Ronelle Nigh Performed by: anesthesiologist   Preanesthetic Checklist Completed: patient identified, site marked, surgical consent, pre-op evaluation, timeout performed, IV checked, risks and benefits discussed and monitors and equipment checked  Epidural Patient position: sitting Prep: site prepped and draped and DuraPrep Patient monitoring: continuous pulse ox and blood pressure Approach: midline Location: L3-L4 Injection technique: LOR air  Needle:  Needle type: Tuohy  Needle gauge: 17 G Needle length: 9 cm and 9 Needle insertion depth: 6 cm Catheter type: closed end flexible Catheter size: 19 Gauge Catheter at skin depth: 11 cm Test dose: negative  Assessment Sensory level: T10 Events: blood not aspirated, injection not painful, no injection resistance, negative IV test and no paresthesia  Additional Notes Patient identified. Risks/Benefits/Options discussed with patient including but not limited to bleeding, infection, nerve damage, paralysis, failed block, incomplete pain control, headache, blood pressure changes, nausea, vomiting, reactions to medication both or allergic, itching and postpartum back pain. Confirmed with bedside nurse the patient's most recent platelet count. Confirmed with patient that they are not currently taking any anticoagulation, have any bleeding history or any family history of bleeding disorders. Patient expressed understanding and wished to proceed. All questions were answered. Sterile technique was used throughout the entire procedure. Please see nursing notes for vital signs. Test dose was given through epidural catheter and negative prior to continuing to dose epidural or start infusion. Warning signs of high block given to the patient including shortness of breath, tingling/numbness in hands, complete motor  block, or any concerning symptoms with instructions to call for help. Patient was given instructions on fall risk and not to get out of bed. All questions and concerns addressed with instructions to call with any issues or inadequate analgesia.

## 2015-04-12 NOTE — Consult Note (Signed)
[facility] INITIAL CONSULT  The patient was crying in pain as I entered her room, asking for an epidural. The anesthesiologist had been called 15 minutes prior to this but was in the operating room with a patient. To expedite the process, I obtained informed consent from the patient, positioned her on the bed, set up the epidural, tray, prepped and sterilely draped the patient. The anesthesiologist was called again, informed that the patient was 9 cm, and that I had the patient ready for him as soon as he was able to come.  Pain goal of patient: 3 Pain Level: 6-7 The epidural was placed during this visit with immediate relief of the worst of he pain. Pain scale was discussed and understood by the patient. She was told that the Team would follow and assess her progress and comfort.  Karleen Dolphin CRNA

## 2015-04-12 NOTE — Progress Notes (Signed)
Patient ID: Colleen Shaw, female   DOB: 10-04-1995, 20 y.o.   MRN: [id number] Colleen Shaw is a 20 y.o. G2P1001 at [redacted]w[redacted]d.  Subjective: Comfortable w/ epidural. Denies pelvic pressure.   Objective: BP 146/93 mmHg  Pulse 108  Temp(Src) 99.1 F (37.3 C) (Oral)  Resp 16  Ht 5' (1.524 m)  Wt 155 lb (70.308 kg)  BMI 30.27 kg/m2  LMP 06/03/2014 (Within Weeks)   FHT:  FHR: 125 bpm, variability: mod,  accelerations:  15x15,  decelerations:  none UC:   Q 2-4 minutes, strong  Dilation: Lip/rim Effacement (%): 100 Station: +1 Presentation: Vertex Exam by:: Ivonne Andrew, CNM  AROM moderate amount of clear fluid  Labs: Results for orders placed or performed during the hospital encounter of 04/12/15 (from the past 24 hour(s))  OB RESULT CONSOLE Group B Strep     Status: None   Collection Time: 04/12/15 12:00 AM  Result Value Ref Range   GBS Positive   Group B strep by PCR     Status: Abnormal   Collection Time: 04/12/15  5:35 AM  Result Value Ref Range   Group B strep by PCR POSITIVE (A) NEGATIVE  CBC     Status: Abnormal   Collection Time: 04/12/15  5:40 AM  Result Value Ref Range   WBC 14.1 (H) 4.0 - 10.5 K/uL   RBC 3.49 (L) 3.87 - 5.11 MIL/uL   Hemoglobin 8.9 (L) 12.0 - 15.0 g/dL   HCT 40.9 (L) 81.1 - 91.4 %   MCV 79.4 78.0 - 100.0 fL   MCH 25.5 (L) 26.0 - 34.0 pg   MCHC 32.1 30.0 - 36.0 g/dL   RDW 78.2 95.6 - 21.3 %   Platelets 427 (H) 150 - 400 K/uL  Comprehensive metabolic panel     Status: Abnormal   Collection Time: 04/12/15  5:40 AM  Result Value Ref Range   Sodium 137 135 - 145 mmol/L   Potassium 3.7 3.5 - 5.1 mmol/L   Chloride 107 101 - 111 mmol/L   CO2 22 22 - 32 mmol/L   Glucose, Bld 95 65 - 99 mg/dL   BUN 8 6 - 20 mg/dL   Creatinine, Ser 0.86 0.44 - 1.00 mg/dL   Calcium 8.6 (L) 8.9 - 10.3 mg/dL   Total Protein 6.7 6.5 - 8.1 g/dL   Albumin 3.0 (L) 3.5 - 5.0 g/dL   AST 23 15 - 41 U/L   ALT 5 (L) 14 - 54 U/L   Alkaline Phosphatase 180 (H) 38 - 126 U/L   Total  Bilirubin 0.3 0.3 - 1.2 mg/dL   GFR calc non Af Amer >60 >60 mL/min   GFR calc Af Amer >60 >60 mL/min   Anion gap 8 5 - 15  Type and screen [facility]     Status: None   Collection Time: 04/12/15  5:40 AM  Result Value Ref Range   ABO/RH(D) O POS    Antibody Screen NEG    Sample Expiration 04/15/2015   Protein / creatinine ratio, urine     Status: Abnormal   Collection Time: 04/12/15  7:55 AM  Result Value Ref Range   Creatinine, Urine 53.00 mg/dL   Total Protein, Urine 13 mg/dL   Protein Creatinine Ratio 0.25 (H) 0.00 - 0.15 mg/mg[Cre]  Urinalysis, Routine w reflex microscopic (not at [facility])     Status: Abnormal   Collection Time: 04/12/15  7:55 AM  Result Value Ref Range   Color, Urine YELLOW YELLOW  APPearance CLEAR CLEAR   Specific Gravity, Urine 1.020 1.005 - 1.030   pH 8.0 5.0 - 8.0   Glucose, UA NEGATIVE NEGATIVE mg/dL   Hgb urine dipstick SMALL (A) NEGATIVE   Bilirubin Urine NEGATIVE NEGATIVE   Ketones, ur NEGATIVE NEGATIVE mg/dL   Protein, ur NEGATIVE NEGATIVE mg/dL   Nitrite NEGATIVE NEGATIVE   Leukocytes, UA NEGATIVE NEGATIVE  Urine rapid drug screen (hosp performed)not at [facility]     Status: None   Collection Time: 04/12/15  7:55 AM  Result Value Ref Range   Opiates NONE DETECTED NONE DETECTED   Cocaine NONE DETECTED NONE DETECTED   Benzodiazepines NONE DETECTED NONE DETECTED   Amphetamines NONE DETECTED NONE DETECTED   Tetrahydrocannabinol NONE DETECTED NONE DETECTED   Barbiturates NONE DETECTED NONE DETECTED  Urine microscopic-add on     Status: Abnormal   Collection Time: 04/12/15  7:55 AM  Result Value Ref Range   Squamous Epithelial / LPF 0-5 (A) NONE SEEN   WBC, UA 0-5 0 - 5 WBC/hpf   RBC / HPF 0-5 0 - 5 RBC/hpf   Bacteria, UA FEW (A) NONE SEEN    Assessment / Plan: [redacted]w[redacted]d week IUP Labor: Transition Fetal Wellbeing:  Category I Pain Control:  Epidural Anticipated MOD:  SVD Tried pushing w/ one contraction, but pt unable to feel  anything or push effectively. Will labor down x 1 hour.   [place], CNM 04/12/2015 1:50 PM

## 2015-04-12 NOTE — Progress Notes (Signed)
Patient educated on signs and symptoms of increased blood pressures related to preeclampsia. Patient educated on IV Labetalol and what to expect during admission. Pt verbalizes understanding.

## 2015-04-12 NOTE — H&P (Signed)
LABOR ADMISSION HISTORY AND PHYSICAL  Colleen Shaw is a 20 y.o. female G2P1001 with IUP at [redacted]w[redacted]d presenting for SOL.  She reports contractions began at 0200 this morning - now stronger and more frequent.  She also passed a quarter-sized blood clot in the toilet.  No other VB.  Denies LOF. She reports +FM.  Denies blurry vision, headaches, peripheral edema, or RUQ pain.  She has a h/o PIH and PPH in prior pregnancy.  Limited PNC this pregnancy.  She plans on bottle feeding. She request IUD for birth control.  Dating: By LMP --->  Estimated Date of Delivery: 04/13/15  Sono:    , CWD, normal anatomy   Prenatal History/Complications: Limited PNC Fetal u/s echogenic cardiac focus PIH H/o PPH Chlamydia during pregnancy H/o depression Tobacco use   Past Medical History: Past Medical History  Diagnosis Date  . Pregnancy induced hypertension 2015    Past Surgical History: History reviewed. No pertinent past surgical history.  Obstetrical History: OB History    Gravida Para Term Preterm AB TAB SAB Ectopic Multiple Living   Social History: Social History   Social History  . Marital Status: Single    Spouse Name: N/A  . Number of Children: N/A  . Years of Education: N/A   Social History Main Topics  . Smoking status: Never Smoker   . Smokeless tobacco: Never Used  . Alcohol Use: No  . Drug Use: No  . Sexual Activity: Yes   Other Topics Concern  . None   Social History Narrative    Family History: History reviewed. No pertinent family history.  Allergies: Allergies  Allergen Reactions  . Strawberry Extract Hives and Swelling    Prescriptions prior to admission  Medication Sig Dispense Refill Last Dose  . Prenatal Vit-Fe Fumarate-FA (PRENATAL COMPLETE) 14-0.4 MG TABS Take 1 tablet by mouth daily. 60 each 3 Taking     Review of Systems   All systems reviewed and negative except as stated in HPI  BP 153/87 mmHg  Pulse 111   Temp(Src) 98 F (36.7 C) (Oral)  Resp 18  LMP 06/03/2014 (Within Weeks) General appearance: alert, cooperative, appears stated age and no distress Lungs: clear to auscultation bilaterally Heart: regular rate and rhythm Abdomen: soft, non-tender; bowel sounds normal Pelvic: adequate, CE below Extremities: Homans sign is negative, no sign of DVT, edema DTR's 2+ Presentation: cephalic Fetal monitoringBaseline: 140 bpm, Variability: Good {> 6 bpm) and Accelerations: Reactive Uterine activityFrequency: Every 5 minutes Dilation: 5 Effacement (%): 90 Station: -2 Exam by:: Dr. Audrea Muscat   Prenatal labs: ABO, Rh: O/Positive/-- (07/29 0000) Antibody: Negative (07/29 0000) Rubella: !Error!immune RPR:   non-reactive HBsAg: Negative (07/29 0000)  HIV: Non-reactive (07/29 0000)  GBS:   unknown 1 hr Glucola 102 Genetic screening  Too late  Anatomy US Fetal u/s echogenic cardiac focus  Prenatal Transfer Tool  Maternal Diabetes: No Genetic Screening: Declined Maternal Ultrasounds/Referrals: Abnormal:  Findings:   Fetal Heart Anomalies Fetal Ultrasounds or other Referrals:  Other:  Fetal u/s echogenic cardiac focus Maternal Substance Abuse:  Yes:  Type: Smoker Significant Maternal Medications:  None Significant Maternal Lab Results: GBS unknown  No results found for this or any previous visit (from the past 24 hour(s)).  Patient Active Problem List   Diagnosis Date Noted  . Active labor at term 04/12/2015  . PIH (pregnancy induced hypertension) 04/12/2015  . H/O postpartum hemorrhage, currently pregnant  04/12/2015  . Fetal echogenic intracardiac focus on prenatal ultrasound 11/14/2014    Assessment: Colleen Shaw is a 20 y.o. G2P1001 at [redacted]w[redacted]d here for SOL and PIH.   # Labor: pt in early active labor -admit to L&D with routine orders  -expectant mgmt at this time   #PIH: 150/100s; h/o PIH in prior pregnancy, no pre-eclampsia symptoms  -monitor BP - may need to start Mag if reach  severe range -CBC, CMP, Pr/Cr ratio   #Limited PNC:  -UDS  #FWB: Category 1 -Continue monitoring   # Pain Control: wants epidrual -analgesia prn  # GBS: unknown -PCR pending  # Postpartum plan:  -Feeding: bottle -Contraception: IUD  Wynne Dust, MD, PGY-1  OB  CNM attestation:  I agree with above documentation in the resident's note.   Colleen Shaw is a 20 y.o. G2P1001 here for SOL/sm vag bldg  PE: BP 154/109 mmHg  Pulse 96  Temp(Src) 98.4 F (36.9 C) (Oral)  Resp 18  Ht 5' (1.524 m)  Wt 70.308 kg (155 lb)  BMI 30.27 kg/m2  LMP 06/03/2014 (Within Weeks) Gen: breathing w/ ctx Resp: normal effort, no distress Abd: gravid  ROS, labs, PMH reviewed  Plan: Admit to [facility] Expectant management Pre-e labs and GBS PCR pending Will need SW PP  Colleen Shaw 04/12/2015, 9:24 AM

## 2015-04-12 NOTE — Anesthesia Preprocedure Evaluation (Signed)
Anesthesia Evaluation  Patient identified by MRN, date of birth, ID band Patient awake    Reviewed: Allergy & Precautions, H&P , NPO status , Patient's Chart, lab work & pertinent test results  Airway Mallampati: II  TM Distance: >3 FB Neck ROM: full    Dental no notable dental hx. (+) Dental Advisory Given, Teeth Intact   Pulmonary neg pulmonary ROS,    Pulmonary exam normal breath sounds clear to auscultation       Cardiovascular Exercise Tolerance: Good hypertension, Pt. on medications and Pt. on home beta blockers negative cardio ROS Normal cardiovascular exam Rhythm:regular Rate:Normal  PIH   Neuro/Psych negative neurological ROS  negative psych ROS   GI/Hepatic negative GI ROS, Neg liver ROS,   Endo/Other  negative endocrine ROS  Renal/GU negative Renal ROS  negative genitourinary   Musculoskeletal   Abdominal   Peds  Hematology negative hematology ROS (+)   Anesthesia Other Findings History PPH  Reproductive/Obstetrics negative OB ROS (+) Pregnancy                             Anesthesia Physical Anesthesia Plan  ASA: III  Anesthesia Plan: Epidural   Post-op Pain Management:    Induction:   Airway Management Planned:   Additional Equipment:   Intra-op Plan:   Post-operative Plan:   Informed Consent: I have reviewed the patients History and Physical, chart, labs and discussed the procedure including the risks, benefits and alternatives for the proposed anesthesia with the patient or authorized representative who has indicated his/her understanding and acceptance.   Dental Advisory Given  Plan Discussed with: CRNA and Surgeon  Anesthesia Plan Comments:         Anesthesia Quick Evaluation

## 2015-04-13 LAB — COMPREHENSIVE METABOLIC PANEL
ALT: 8 U/L — ABNORMAL LOW (ref 14–54)
ANION GAP: 6 (ref 5–15)
AST: 23 U/L (ref 15–41)
Albumin: 2.5 g/dL — ABNORMAL LOW (ref 3.5–5.0)
Alkaline Phosphatase: 150 U/L — ABNORMAL HIGH (ref 38–126)
BUN: <5 mg/dL — ABNORMAL LOW (ref 6–20)
CHLORIDE: 105 mmol/L (ref 101–111)
CO2: 25 mmol/L (ref 22–32)
Calcium: 7.3 mg/dL — ABNORMAL LOW (ref 8.9–10.3)
Creatinine, Ser: 0.55 mg/dL (ref 0.44–1.00)
Glucose, Bld: 98 mg/dL (ref 65–99)
POTASSIUM: 3.4 mmol/L — AB (ref 3.5–5.1)
SODIUM: 136 mmol/L (ref 135–145)
Total Bilirubin: 0.5 mg/dL (ref 0.3–1.2)
Total Protein: 6.4 g/dL — ABNORMAL LOW (ref 6.5–8.1)

## 2015-04-13 LAB — CBC
HCT: 27.2 % — ABNORMAL LOW (ref 36.0–46.0)
Hemoglobin: 8.9 g/dL — ABNORMAL LOW (ref 12.0–15.0)
MCH: 25.7 pg — AB (ref 26.0–34.0)
MCHC: 32.7 g/dL (ref 30.0–36.0)
MCV: 78.6 fL (ref 78.0–100.0)
PLATELETS: 380 10*3/uL (ref 150–400)
RBC: 3.46 MIL/uL — AB (ref 3.87–5.11)
RDW: 14.7 % (ref 11.5–15.5)
WBC: 29.8 10*3/uL — AB (ref 4.0–10.5)

## 2015-04-13 LAB — RPR: RPR: NONREACTIVE

## 2015-04-13 MED ORDER — DILTIAZEM HCL ER COATED BEADS 120 MG PO CP24
120.0000 mg | ORAL_CAPSULE | Freq: Every day | ORAL | Status: DC
  Administered 2015-04-13 – 2015-04-14 (×2): 120 mg via ORAL
  Filled 2015-04-13 (×3): qty 1

## 2015-04-13 NOTE — Clinical Social Work Maternal (Signed)
  CLINICAL SOCIAL WORK MATERNAL/CHILD NOTE  Patient Details  Name: Colleen Shaw MRN: [id number] Date of Birth: 1995/08/06  Date:  04/13/2015  Clinical Social Worker Initiating Note:  Norlene Duel, LCSW Date/ Time Initiated:  04/13/15/1115     Child's Name:  Colleen Shaw   Legal Guardian:   (Parents Colleen Shaw and Colleen Shaw)   Need for Interpreter:  None   Date of Referral:  04/12/15     Reason for Referral:      Referral Source:  [facility]   Address:  [address]  [place], [place] [address]  Phone number:   [phone])   Household Members:  Self, Minor Children, Relatives   Natural Supports (not living in the home):  Spouse/significant other, Extended Family   Professional Supports: None   Employment:  (FOB is employed)   Type of Work:     Education:      Pensions consultant:  [employer]   Other Resources:  [employer], Physicist, medical    Cultural/Religious Considerations Which May Impact Care:  none noted  Strengths:  Ability to meet basic needs , Home prepared for child    Risk Factors/Current Problems:   (Hx of depression)   Cognitive State:  Able to Concentrate , Alert    Mood/Affect:  Happy    CSW Assessment:  Acknowledged order for social work consult to assess mother's hx of Depression, and limited PNC.   Also, mother expressed need for assistance with obtaining d/c medication and a car seat.  Met with mother who was pleasant and receptive to social work.    FOB was also present and attentive to the other child.    Mother reports hx of depression and notes that she sought counseling when she was pregnant with the first child.   She denies any current symptoms.  Discussed reason for the limited prenatal care and she did not have one.  She denies any hx of substance abuse.   UDS on newborn pending.  Mother informed of the hospital drug screen policy.   Mother states that she is no longer concerned about the medication issue because she received her  Medicaid card about 2 weeks ago.  She also states that she has a car seat, but it's missing the base.  She is aware of [facility].  She will inform her nurse if it is needed prior to  discharge.  No acute social concerns noted or reported at this time.   Affect and behavior was appropriate during the entire visit.  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  CSW Plan/Description:     Discussed signs/symptoms of PP Depression and available resources No barriers to discharge Will monitor drug screen.   Colleen Shaw J, LCSW 04/13/2015, 1:23 PM

## 2015-04-13 NOTE — Progress Notes (Addendum)
POSTPARTUM PROGRESS NOTE  Post Partum Day 1 Subjective:  Colleen Shaw is a 20 y.o. G2P2001 [redacted]w[redacted]d s/p nsvd. pree with severe features. No ha, vision change, ruq pain, or sob.  No acute events overnight.  Pt denies problems with ambulating, voiding or po intake.  She denies nausea or vomiting.  Pain is well controlled.  She has had flatus. She has not had bowel movement.  Lochia Small.   Objective: Blood pressure 113/68, pulse 90, temperature 100.2 F (37.9 C), temperature source Axillary, resp. rate 16, height  (1.575 m), weight 155 lb (70.308 kg), last menstrual period 06/03/2014, SpO2 99 %, unknown if currently breastfeeding.  Physical Exam:  General: alert, cooperative and no distress Lochia:normal flow Chest: CTAB Heart: RRR no m/r/g Abdomen: +BS, soft, nontender,  Uterine Fundus: firm,  DVT Evaluation: No calf swelling or tenderness Extremities: trace LE edema   Recent Labs  04/12/15 1626 04/13/15 0517  HGB 8.9* 8.9*  HCT 27.2* 27.2*    Assessment/Plan:  ASSESSMENT: Colleen Shaw is a 20 y.o. G2P2001 [redacted]w[redacted]d s/p nsvd, doing we.. preE w/ severe features, receiving 24 hours PP Mg. BPs wnl overnight, will convert procardia bid dosing to diltiazem 120 qd  Plan for discharge tomorrow   LOS: 1 day   Silvano Bilis 04/13/2015, 6:52 AM

## 2015-04-13 NOTE — Anesthesia Postprocedure Evaluation (Signed)
Anesthesia Post Note  Patient: Colleen Shaw  Procedure(s) Performed: * No procedures listed *  Patient location during evaluation: Antenatal Anesthesia Type: Epidural Level of consciousness: awake and alert, oriented and patient cooperative Pain management: pain level controlled Vital Signs Assessment: post-procedure vital signs reviewed and stable Respiratory status: spontaneous breathing Cardiovascular status: stable Postop Assessment: no headache, epidural receding, patient able to bend at knees and no signs of nausea or vomiting Anesthetic complications: no    Last Vitals:  Filed Vitals:   04/13/15 0600 04/13/15 0700  BP: 113/68 111/65  Pulse: 90 81  Temp:    Resp: 16 18    Last Pain:  Filed Vitals:   04/13/15 0708  PainSc: Asleep                 Merrilyn Puma

## 2015-04-14 LAB — GC/CHLAMYDIA PROBE AMP (~~LOC~~) NOT AT ARMC
CHLAMYDIA, DNA PROBE: NEGATIVE
Neisseria Gonorrhea: NEGATIVE

## 2015-04-14 LAB — RUBELLA SCREEN: Rubella: <0.9 index — ABNORMAL LOW (ref >0.99)

## 2015-04-14 MED ORDER — SERTRALINE HCL 50 MG PO TABS: 50.0000 mg | ORAL_TABLET | Freq: Every day | ORAL | Status: DC

## 2015-04-14 MED ORDER — IBUPROFEN 600 MG PO TABS
600.0000 mg | ORAL_TABLET | Freq: Four times a day (QID) | ORAL | Status: DC
Start: 2015-04-14 — End: 2015-11-13

## 2015-04-14 MED ORDER — DILTIAZEM HCL ER COATED BEADS 120 MG PO CP24: 120.0000 mg | ORAL_CAPSULE | Freq: Every day | ORAL | Status: DC

## 2015-04-14 MED ORDER — FERROUS SULFATE 325 (65 FE) MG PO TABS: 325.0000 mg | ORAL_TABLET | Freq: Two times a day (BID) | ORAL | Status: DC

## 2015-04-14 MED ORDER — HYDROCHLOROTHIAZIDE 12.5 MG PO TABS: 12.5000 mg | ORAL_TABLET | Freq: Every day | ORAL | Status: DC

## 2015-04-14 NOTE — Progress Notes (Signed)
Reviewed discharge instructions with pt verbally, with verbal understanding from patient. Patient signed & received copy of discharge instructions. Patient discharged out to home with baby, boyfriend, & her sister. Patient insisted on ambulating out of hospital. Infant's hugs bracelet cut off in nursery by nursery staff on way out.

## 2015-04-14 NOTE — Discharge Summary (Addendum)
OB Discharge Summary     Patient Name: Colleen Shaw DOB: Aug 20, 1995 MRN: [id number]  Date of admission: 04/12/2015 Delivering MD: Dorathy Kinsman   Date of discharge: 04/14/2015  Admitting diagnosis: 40wks, contractions Intrauterine pregnancy: [redacted]w[redacted]d     Secondary diagnosis:  Principal Problem:   Active labor at term Active Problems:   Pre-eclampsia, severe, delivered   H/O postpartum hemorrhage, currently pregnant   Chlamydia infection affecting pregnancy  Additional problems: pt with h/o depression and postpartum depression- prev untreated with meds       Discharge diagnosis: Term Pregnancy Delivered, Preeclampsia (severe) and h/o postpartum depression                                                                                                Post partum procedures:Magnesium sulfate  Complications: None  Hospital course:  Onset of Labor With Vaginal Delivery     20 y.o. yo G2P2001 at [redacted]w[redacted]d was admitted in Active Labor on 04/12/2015. Patient had an uncomplicated labor course as follows:  Membrane Rupture Time/Date: 1:20 PM ,04/12/2015   Intrapartum Procedures: Episiotomy: None [1]                                         Lacerations:  None [1]  Patient had a delivery of a Viable infant. 04/12/2015  Information for the patient's newborn:  Chrislynn, Mosely Girl Thandiwe [[id number]]  Delivery Method: Vaginal, Spontaneous Delivery (Filed from Delivery Summary)     Pateint received 24 hours of magnesium sulfate for preeclampsia with severe features  She was started on Caredizem.  Her BP's have been WNL.  She was seen by SW for issues fo car seat and need for med assistance but, pt declined the need for help. I asked her again if she would be able to get her meds and she said that this would not be a problem. She reports a h/o untreated depression and a h/o PP depression with her 52 month old. She reports that she would like treatment.  She is ambulating, tolerating a regular diet, passing  flatus, and urinating well. Patient is discharged home in stable condition on 04/14/2015.    Physical exam  Filed Vitals:   04/13/15 1900 04/13/15 2045 04/13/15 2353 04/14/15 0557  BP: 123/84 129/82 123/83 144/89  Pulse: 79 79 69 75  Temp:  98.2 F (36.8 C) 98.5 F (36.9 C) 98.1 F (36.7 C)  TempSrc:  Oral Oral Oral  Resp:  Height:      Weight:      SpO2:  98% 98% 99%   General: alert Lochia: appropriate Uterine Fundus: firm Incision: N/A DVT Evaluation: No evidence of DVT seen on physical exam. Labs: Lab Results  Component Value Date   WBC 29.8* 04/13/2015   HGB 8.9* 04/13/2015   HCT 27.2* 04/13/2015   MCV 78.6 04/13/2015   PLT 380 04/13/2015   CMP Latest Ref Rng 04/13/2015  Glucose 65 - 99 mg/dL 98  BUN 6 - 20 mg/dL <  5(L)  Creatinine 0.44 - 1.00 mg/dL 1.61  Sodium 096 - 045 mmol/L 136  Potassium 3.5 - 5.1 mmol/L 3.4(L)  Chloride 101 - 111 mmol/L 105  CO2 22 - 32 mmol/L 25  Calcium 8.9 - 10.3 mg/dL 7.3(L)  Total Protein 6.5 - 8.1 g/dL 6.4(L)  Total Bilirubin 0.3 - 1.2 mg/dL 0.5  Alkaline Phos 38 - 126 U/L 150(H)  AST 15 - 41 U/L 23  ALT 14 - 54 U/L 8(L)    Discharge instruction: per After Visit Summary and "Baby and Me Booklet".  After visit meds:    Medication List    TAKE these medications        ferrous sulfate 325 (65 FE) MG tablet  Take 1 tablet (325 mg total) by mouth 2 (two) times daily with a meal.     hydrochlorothiazide 12.5 MG tablet  Commonly known as:  HYDRODIURIL  Take 1 tablet (12.5 mg total) by mouth daily.     ibuprofen 600 MG tablet  Commonly known as:  ADVIL,MOTRIN  Take 1 tablet (600 mg total) by mouth every 6 (six) hours.     PRENATAL COMPLETE 14-0.4 MG Tabs  Take 1 tablet by mouth daily.     sertraline 50 MG tablet  Commonly known as:  ZOLOFT  Take 1 tablet (50 mg total) by mouth daily.        Diet: low salt diet  Activity: Advance as tolerated. Pelvic rest for 6 weeks.   Outpatient follow up:2 weeks,  needs BMP at that appt (HCTZ started) Follow up Appt:No future appointments. Follow up Visit:No Follow-up on file.  Postpartum contraception: IUD Mirena  Newborn Data: Live born female  Birth Weight: 6 lb 1 oz (2750 g) APGAR: 9, 9  Baby Feeding: Bottle Disposition:home with mother   04/14/2015 Silvano Bilis, MD

## 2015-04-14 NOTE — Discharge Instructions (Signed)
Postpartum Hypertension  Postpartum hypertension is high blood pressure after pregnancy that remains higher than normal for more than two days after delivery. You may not realize that you have postpartum hypertension if your blood pressure is not being checked regularly. In some cases, postpartum hypertension will go away on its own, usually within a week of delivery. However, for some women, medical treatment is required to prevent serious complications, such as seizures or stroke.  The following things can affect your blood pressure:  · The type of delivery you had.  · Having received IV fluids or other medicines during or after delivery.  CAUSES   Postpartum hypertension may be caused by any of the following or by a combination of any of the following:  · Hypertension that existed before pregnancy (chronic hypertension).  · Gestational hypertension.  · Preeclampsia or eclampsia.  · Receiving a lot of fluid through an IV during or after delivery.  · Medicines.  · HELLP syndrome.  · Hyperthyroidism.  · Stroke.  · Other rare neurological or blood disorders.  In some cases, the cause may not be known.  RISK FACTORS  Postpartum hypertension can be related to one or more risk factors, such as:  · Chronic hypertension. In some cases, this may not have been diagnosed before pregnancy.  · Obesity.  · Type 2 diabetes.  · Kidney disease.  · Family history of preeclampsia.  · Other medical conditions that cause hormonal imbalances.  SIGNS AND SYMPTOMS  As with all types of hypertension, postpartum hypertension may not have any symptoms. Depending on how high your blood pressure is, you may experience:  · Headaches. These may be mild, moderate, or severe. They may also be steady, constant, or sudden in onset (thunderclap headache).  · Visual changes.  · Dizziness.  · Shortness of breath.  · Swelling of your hands, feet, lower legs, or face. In some cases, you may have swelling in more than one of these locations.  · Heart  palpitations or a racing heartbeat.  · Difficulty breathing while lying down.  · Decreased urination.  Other rare signs and symptoms may include:  · Sweating more than usual. This lasts longer than a few days after delivery.  · Chest pain.  · Sudden dizziness when you get up from sitting or lying down.  · Seizures.  · Nausea or vomiting.  · Abdominal pain.  DIAGNOSIS  The diagnosis of postpartum hypertension is made through a combination of physical examination findings and testing of your blood and urine. You may also have additional tests, such as a CT scan or an MRI, to check for other complications of postpartum hypertension.  TREATMENT  When blood pressure is high enough to require treatment, your options may include:  · Medicines to reduce blood pressure (antihypertensives). Tell your health care provider if you are breastfeeding or if you plan to breastfeed. There are many antihypertensive medicines that are safe to take while breastfeeding.  · Stopping medicines that may be causing hypertension.  · Treating medical conditions that are causing hypertension.  · Treating the complications of hypertension, such as seizures, stroke, or kidney problems.  Your health care provider will also continue to monitor your blood pressure closely and repeatedly until it is within a safe range for you.   HOME CARE INSTRUCTIONS  · Take medicines only as directed by your health care provider.  · Get regular exercise after your health care provider tells you that it is safe.  · Follow   Your symptoms get worse.  You have new symptoms, such as:  Headache.  Dizziness.  Visual  changes. SEEK IMMEDIATE MEDICAL CARE IF:  You develop a severe or sudden headache.  You have seizures.  You develop numbness or weakness on one side of your body.  You have difficulty thinking, speaking, or swallowing.  You develop severe abdominal pain.  You develop difficulty breathing, chest pain, a racing heartbeat, or heart palpitations. These symptoms may represent a serious problem that is an emergency. Do not wait to see if the symptoms will go away. Get medical help right away. Call your local emergency services (911 in the [place] Do not drive yourself to the hospital.   This information is not intended to replace advice given to you by your health care provider. Make sure you discuss any questions you have with your health care provider.   Document Released: 10/19/2013 Document Reviewed: 10/19/2013 Elsevier Interactive Patient Education [employer] Postpartum Depression and Baby Blues The postpartum period begins right after the birth of a baby. During this time, there is often a great amount of joy and excitement. It is also a time of many changes in the life of the parents. Regardless of how many times a mother gives birth, each child brings new challenges and dynamics to the family. It is not unusual to have feelings of excitement along with confusing shifts in moods, emotions, and thoughts. All mothers are at risk of developing postpartum depression or the "baby blues." These mood changes can occur right after giving birth, or they may occur many months after giving birth. The baby blues or postpartum depression can be mild or severe. Additionally, postpartum depression can go away rather quickly, or it can be a long-term condition.  CAUSES Raised hormone levels and the rapid drop in those levels are thought to be a main cause of postpartum depression and the baby blues. A number of hormones change during and after pregnancy. Estrogen and progesterone usually decrease  right after the delivery of your baby. The levels of thyroid hormone and various cortisol steroids also rapidly drop. Other factors that play a role in these mood changes include major life events and genetics.  RISK FACTORS If you have any of the following risks for the baby blues or postpartum depression, know what symptoms to watch out for during the postpartum period. Risk factors that may increase the likelihood of getting the baby blues or postpartum depression include:  Having a personal or family history of depression.   Having depression while being pregnant.   Having premenstrual mood issues or mood issues related to oral contraceptives.  Having a lot of life stress.   Having marital conflict.   Lacking a social support network.   Having a baby with special needs.   Having health problems, such as diabetes.  SIGNS AND SYMPTOMS Symptoms of baby blues include:  Brief changes in mood, such as going from extreme happiness to sadness.  Decreased concentration.   Difficulty sleeping.   Crying spells, tearfulness.   Irritability.   Anxiety.  Symptoms of postpartum depression typically begin within the first month after giving birth. These symptoms include:  Difficulty sleeping or excessive sleepiness.   Marked weight loss.   Agitation.   Feelings of worthlessness.   Lack of interest in activity or food.  Postpartum psychosis is a very serious condition and can be dangerous. Fortunately, it is rare. Displaying any of the following symptoms is cause for immediate medical attention. Symptoms  of postpartum psychosis include:   Hallucinations and delusions.   Bizarre or disorganized behavior.   Confusion or disorientation.  DIAGNOSIS  A diagnosis is made by an evaluation of your symptoms. There are no medical or lab tests that lead to a diagnosis, but there are various questionnaires that a health care provider may use to identify those with the  baby blues, postpartum depression, or psychosis. Often, a screening tool called the [place] Postnatal Depression Scale is used to diagnose depression in the postpartum period.  TREATMENT The baby blues usually goes away on its own in 1-2 weeks. Social support is often all that is needed. You will be encouraged to get adequate sleep and rest. Occasionally, you may be given medicines to help you sleep.  Postpartum depression requires treatment because it can last several months or longer if it is not treated. Treatment may include individual or group therapy, medicine, or both to address any social, physiological, and psychological factors that may play a role in the depression. Regular exercise, a healthy diet, rest, and social support may also be strongly recommended.  Postpartum psychosis is more serious and needs treatment right away. Hospitalization is often needed. HOME CARE INSTRUCTIONS  Get as much rest as you can. Nap when the baby sleeps.   Exercise regularly. Some women find yoga and walking to be beneficial.   Eat a balanced and nourishing diet.   Do little things that you enjoy. Have a cup of tea, take a bubble bath, read your favorite magazine, or listen to your favorite music.  Avoid alcohol.   Ask for help with household chores, cooking, grocery shopping, or running errands as needed. Do not try to do everything.   Talk to people close to you about how you are feeling. Get support from your partner, family members, friends, or other new moms.  Try to stay positive in how you think. Think about the things you are grateful for.   Do not spend a lot of time alone.   Only take over-the-counter or prescription medicine as directed by your health care provider.  Keep all your postpartum appointments.   Let your health care provider know if you have any concerns.  SEEK MEDICAL CARE IF: You are having a reaction to or problems with your medicine. SEEK IMMEDIATE  MEDICAL CARE IF:  You have suicidal feelings.   You think you may harm the baby or someone else. MAKE SURE YOU:  Understand these instructions.  Will watch your condition.  Will get help right away if you are not doing well or get worse.   This information is not intended to replace advice given to you by your health care provider. Make sure you discuss any questions you have with your health care provider.   Document Released: 11/20/2003 Document Revised: 02/20/2013 Document Reviewed: 11/27/2012 Elsevier Interactive Patient Education [employer]

## 2015-04-15 ENCOUNTER — Telehealth: Payer: Self-pay

## 2015-04-15 NOTE — Telephone Encounter (Signed)
Received call from [facility] in [place]. Pt was prescribed several medicine and these were sent to [facility]. Pt is not an established patient there and they can not fill her medicine. I have left patient a detail message asking what pharmacy would be better for rx to be called into.

## 2015-04-17 NOTE — Telephone Encounter (Signed)
Attempted to reach patient no answer left detailed message stating we are trying to reach patient.

## 2015-05-28 ENCOUNTER — Ambulatory Visit: Payer: Medicaid Other | Admitting: Obstetrics & Gynecology

## 2015-11-13 ENCOUNTER — Inpatient Hospital Stay (HOSPITAL_COMMUNITY)
Admission: AD | Admit: 2015-11-13 | Discharge: 2015-11-13 | Disposition: A | Discharge status: Home / Self Care | Payer: Medicaid Other | Source: Ambulatory Visit | Attending: Obstetrics & Gynecology | Admitting: Obstetrics & Gynecology

## 2015-11-13 ENCOUNTER — Encounter (HOSPITAL_COMMUNITY): Payer: Self-pay | Admitting: Emergency Medicine

## 2015-11-13 ENCOUNTER — Emergency Department (HOSPITAL_COMMUNITY)
Admission: EM | Admit: 2015-11-13 | Discharge: 2015-11-13 | Disposition: A | Discharge status: Home / Self Care | Payer: Medicaid Other | Attending: Emergency Medicine | Admitting: Emergency Medicine

## 2015-11-13 ENCOUNTER — Encounter (HOSPITAL_COMMUNITY): Payer: Self-pay | Admitting: *Deleted

## 2015-11-13 DIAGNOSIS — Z3A14 14 weeks gestation of pregnancy: Secondary | ICD-10-CM | POA: Insufficient documentation

## 2015-11-13 DIAGNOSIS — O9989 Other specified diseases and conditions complicating pregnancy, childbirth and the puerperium: Secondary | ICD-10-CM

## 2015-11-13 DIAGNOSIS — R109 Unspecified abdominal pain: Secondary | ICD-10-CM

## 2015-11-13 DIAGNOSIS — O26899 Other specified pregnancy related conditions, unspecified trimester: Secondary | ICD-10-CM

## 2015-11-13 DIAGNOSIS — O26892 Other specified pregnancy related conditions, second trimester: Secondary | ICD-10-CM | POA: Insufficient documentation

## 2015-11-13 DIAGNOSIS — Z3492 Encounter for supervision of normal pregnancy, unspecified, second trimester: Secondary | ICD-10-CM

## 2015-11-13 DIAGNOSIS — Z3201 Encounter for pregnancy test, result positive: Secondary | ICD-10-CM

## 2015-11-13 LAB — URINALYSIS, ROUTINE W REFLEX MICROSCOPIC
BILIRUBIN URINE: NEGATIVE
Glucose, UA: NEGATIVE mg/dL
HGB URINE DIPSTICK: NEGATIVE
Ketones, ur: 15 mg/dL — AB
NITRITE: NEGATIVE
PROTEIN: 30 mg/dL — AB
SPECIFIC GRAVITY, URINE: 1.02 (ref 1.005–1.030)
pH: 7.5 (ref 5.0–8.0)

## 2015-11-13 LAB — URINE MICROSCOPIC-ADD ON

## 2015-11-13 LAB — POCT PREGNANCY, URINE: PREG TEST UR: POSITIVE — AB

## 2015-11-13 LAB — POC URINE PREG, ED: PREG TEST UR: POSITIVE — AB

## 2015-11-13 NOTE — [facility] Provider Note (Signed)
History     CSN: [id number]  Arrival date and time: 11/13/15 1322   First Provider Initiated Contact with Patient 11/13/15 1408      Chief Complaint  Patient presents with  . Abdominal Pain   HPI Colleen Shaw is a 20 y.o. G3P2001 at [id number] by LMP who presents with abdominal cramping. Symptoms began last night. Described intermittent lower abdominal cramping. Rates pain 6/10. Has not treated. Denies n/v/d, constipation, vaginal bleeding, LOF, vaginal discharge, or dysuria. Pt has not started prenatal care yet as she just found out she was pregnant.   OB History    Gravida Para Term Preterm AB Living   3 2 2     1    SAB TAB Ectopic Multiple Live Births         0 1      Past Medical History:  Diagnosis Date  . Pregnancy induced hypertension 2015    History reviewed. No pertinent surgical history.  History reviewed. No pertinent family history.  Social History  Substance Use Topics  . Smoking status: Never Smoker  . Smokeless tobacco: Never Used  . Alcohol use No    Allergies:  Allergies  Allergen Reactions  . Strawberry Extract Hives and Swelling    No prescriptions prior to admission.    Review of Systems  Constitutional: Negative.   Gastrointestinal: Positive for abdominal pain. Negative for constipation, diarrhea, nausea and vomiting.  Genitourinary: Negative.    Physical Exam   Blood pressure 117/68, pulse 85, temperature 98.2 F (36.8 C), resp. rate 18, last menstrual period 08/05/2015, unknown if currently breastfeeding.  Physical Exam  Nursing note and vitals reviewed. Constitutional: She is oriented to person, place, and time. She appears well-developed and well-nourished. No distress.  HENT:  Head: Normocephalic and atraumatic.  Eyes: Conjunctivae are normal. Right eye exhibits no discharge. Left eye exhibits no discharge. No scleral icterus.  Neck: Normal range of motion.  Cardiovascular: Normal rate, regular rhythm and normal heart sounds.    No murmur heard. Respiratory: Effort normal and breath sounds normal. No respiratory distress. She has no wheezes.  GI: Soft. Bowel sounds are normal. There is no tenderness. There is no rebound and no guarding.  Neurological: She is alert and oriented to person, place, and time.  Skin: Skin is warm and dry. She is not diaphoretic.  Psychiatric: She has a normal mood and affect. Her behavior is normal. Judgment and thought content normal.   Dilation: Closed Exam by:: Judeth HornErin Heron Pitcock, NP  MAU Course  Procedures Results for orders placed or performed during the hospital encounter of 11/13/15 (from the past 24 hour(s))  Urinalysis, Routine w reflex microscopic (not at [facility])     Status: Abnormal   Collection Time: 11/13/15  1:40 PM  Result Value Ref Range   Color, Urine YELLOW YELLOW   APPearance CLEAR CLEAR   Specific Gravity, Urine 1.020 1.005 - 1.030   pH 7.5 5.0 - 8.0   Glucose, UA NEGATIVE NEGATIVE mg/dL   Hgb urine dipstick NEGATIVE NEGATIVE   Bilirubin Urine NEGATIVE NEGATIVE   Ketones, ur 15 (A) NEGATIVE mg/dL   Protein, ur 30 (A) NEGATIVE mg/dL   Nitrite NEGATIVE NEGATIVE   Leukocytes, UA LARGE (A) NEGATIVE  Urine microscopic-add on     Status: Abnormal   Collection Time: 11/13/15  1:40 PM  Result Value Ref Range   Squamous Epithelial / LPF 6-30 (A) NONE SEEN   WBC, UA 6-30 0 - 5 WBC/hpf   RBC / HPF  0-5 0 - 5 RBC/hpf   Bacteria, UA MANY (A) NONE SEEN   Urine-Other MUCOUS PRESENT   Pregnancy, urine POC     Status: Abnormal   Collection Time: 11/13/15  1:44 PM  Result Value Ref Range   Preg Test, Ur POSITIVE (A) NEGATIVE    MDM FHT 164 by doppler Cervix closed U/a shows large leuks, likely contaminated urine -- will send for cx  Assessment and Plan  A: 1. Abdominal cramping affecting pregnancy   2. Fetal heart tones present, second trimester     P: Discharge home Urine culture pending Discussed reasons to return to [facility] Pregnancy verification letter & provider  list given Start prenatal care ASAP  Judeth Horn 11/13/2015, 2:08 PM

## 2015-11-13 NOTE — ED Triage Notes (Signed)
Pt states for the last week she has woke up feeling nauseous . Pt states she took a pregnancy test at home and was positive and today would like to confirm that. Pt denies any pain or vaginal discharge or bleeding. Pt request , "urine pregnancy test".

## 2015-11-13 NOTE — Discharge Instructions (Signed)
Second Trimester of Pregnancy The second trimester is from week 13 through week 28, month 4 through 6. This is often the time in pregnancy that you feel your best. Often times, morning sickness has lessened or quit. You may have more energy, and you may get hungry more often. Your unborn baby (fetus) is growing rapidly. At the end of the sixth month, he or she is about 9 inches long and weighs about 1 pounds. You will likely feel the baby move (quickening) between 18 and 20 weeks of pregnancy. HOME CARE   Avoid all smoking, herbs, and alcohol. Avoid drugs not approved by your doctor.  Do not use any tobacco products, including cigarettes, chewing tobacco, and electronic cigarettes. If you need help quitting, ask your doctor. You may get counseling or other support to help you quit.  Only take medicine as told by your doctor. Some medicines are safe and some are not during pregnancy.  Exercise only as told by your doctor. Stop exercising if you start having cramps.  Eat regular, healthy meals.  Wear a good support bra if your breasts are tender.  Do not use hot tubs, steam rooms, or saunas.  Wear your seat belt when driving.  Avoid raw meat, uncooked cheese, and liter boxes and soil used by cats.  Take your prenatal vitamins.  Take 1500-2000 milligrams of calcium daily starting at the 20th week of pregnancy until you deliver your baby.  Try taking medicine that helps you poop (stool softener) as needed, and if your doctor approves. Eat more fiber by eating fresh fruit, vegetables, and whole grains. Drink enough fluids to keep your pee (urine) clear or pale yellow.  Take warm water baths (sitz baths) to soothe pain or discomfort caused by hemorrhoids. Use hemorrhoid cream if your doctor approves.  If you have puffy, bulging veins (varicose veins), wear support hose. Raise (elevate) your feet for 15 minutes, 3-4 times a day. Limit salt in your diet.  Avoid heavy lifting, wear low heals,  and sit up straight.  Rest with your legs raised if you have leg cramps or low back pain.  Visit your dentist if you have not gone during your pregnancy. Use a soft toothbrush to brush your teeth. Be gentle when you floss.  You can have sex (intercourse) unless your doctor tells you not to.  Go to your doctor visits. GET HELP IF:   You feel dizzy.  You have mild cramps or pressure in your lower belly (abdomen).  You have a nagging pain in your belly area.  You continue to feel sick to your stomach (nauseous), throw up (vomit), or have watery poop (diarrhea).  You have bad smelling fluid coming from your vagina.  You have pain with peeing (urination). GET HELP RIGHT AWAY IF:   You have a fever.  You are leaking fluid from your vagina.  You have spotting or bleeding from your vagina.  You have severe belly cramping or pain.  You lose or gain weight rapidly.  You have trouble catching your breath and have chest pain.  You notice sudden or extreme puffiness (swelling) of your face, hands, ankles, feet, or legs.  You have not felt the baby move in over an hour.  You have severe headaches that do not go away with medicine.  You have vision changes.   This information is not intended to replace advice given to you by your health care provider. Make sure you discuss any questions you have with your  health care provider.   Document Released: 05/12/2009 Document Revised: 03/08/2014 Document Reviewed: 04/18/2012 Elsevier Interactive Patient Education 2016 [employer] Abdominal Pain During Pregnancy Abdominal pain is common in pregnancy. Most of the time, it does not cause harm. There are many causes of abdominal pain. Some causes are more serious than others. Some of the causes of abdominal pain in pregnancy are easily diagnosed. Occasionally, the diagnosis takes time to understand. Other times, the cause is not determined. Abdominal pain can be a sign that something is very  wrong with the pregnancy, or the pain may have nothing to do with the pregnancy at all. For this reason, always tell your health care provider if you have any abdominal discomfort. HOME CARE INSTRUCTIONS  Monitor your abdominal pain for any changes. The following actions may help to alleviate any discomfort you are experiencing:  Do not have sexual intercourse or put anything in your vagina until your symptoms go away completely.  Get plenty of rest until your pain improves.  Drink clear fluids if you feel nauseous. Avoid solid food as long as you are uncomfortable or nauseous.  Only take over-the-counter or prescription medicine as directed by your health care provider.  Keep all follow-up appointments with your health care provider. SEEK IMMEDIATE MEDICAL CARE IF:  You are bleeding, leaking fluid, or passing tissue from the vagina.  You have increasing pain or cramping.  You have persistent vomiting.  You have painful or bloody urination.  You have a fever.  You notice a decrease in your baby's movements.  You have extreme weakness or feel faint.  You have shortness of breath, with or without abdominal pain.  You develop a severe headache with abdominal pain.  You have abnormal vaginal discharge with abdominal pain.  You have persistent diarrhea.  You have abdominal pain that continues even after rest, or gets worse. MAKE SURE YOU:   Understand these instructions.  Will watch your condition.  Will get help right away if you are not doing well or get worse.   This information is not intended to replace advice given to you by your health care provider. Make sure you discuss any questions you have with your health care provider.   Document Released: 02/15/2005 Document Revised: 12/06/2012 Document Reviewed: 09/14/2012 Elsevier Interactive Patient Education [employer]

## 2015-11-13 NOTE — Discharge Instructions (Signed)
The pregnancy test was positive today. Follow-up with OB/GYN.

## 2015-11-13 NOTE — ED Provider Notes (Signed)
[facility]-EMERGENCY DEPT Provider Note   CSN: [id number] Arrival date & time: 11/13/15  1154  By signing my name below, I, Placido Sou, attest that this documentation has been prepared under the direction and in the presence of Adriyana Greenbaum C. Nareg Breighner, PA-C. Electronically Signed: Placido Sou, ED Scribe. 11/13/15. 12:53 PM.   History   Chief Complaint Chief Complaint  Patient presents with  . Possible Pregnancy    HPI HPI Comments: Colleen Shaw is a 20 y.o. female who presents to the Emergency Department requesting a pregnancy screening. Pt is sexually active with a female partner and beginning 1 week ago began experiencing mild nausea. Her LMP was in mid-June. No vaginal discharge or bleeding or other associated symptoms at this time. Patient took a home pregnancy test, which was positive.  The history is provided by the patient. No language interpreter was used.    Past Medical History:  Diagnosis Date  . Pregnancy induced hypertension 2015    Patient Active Problem List   Diagnosis Date Noted  . Active labor at term 04/12/2015  . Pre-eclampsia, severe, delivered 04/12/2015  . H/O postpartum hemorrhage, currently pregnant 04/12/2015  . Chlamydia infection affecting pregnancy 04/12/2015  . Fetal echogenic intracardiac focus on prenatal ultrasound 11/14/2014    History reviewed. No pertinent surgical history.  OB History    Gravida Para Term Preterm AB Living   3 2 2     1    SAB TAB Ectopic Multiple Live Births         0 1       Home Medications    Prior to Admission medications   Not on File    Family History No family history on file.  Social History Social History  Substance Use Topics  . Smoking status: Never Smoker  . Smokeless tobacco: Never Used  . Alcohol use No     Allergies   Strawberry extract   Review of Systems Review of Systems  Gastrointestinal: Positive for nausea. Negative for abdominal pain and vomiting.  Genitourinary: Negative for  vaginal bleeding and vaginal discharge.   Physical Exam Updated Vital Signs BP 131/82   Pulse 92   Temp 98.7 F (37.1 C) (Oral)   Resp 18   Ht 5\' 5"  (1.651 m)   Wt 70.3 kg   LMP 08/05/2015   SpO2 100%   BMI 25.79 kg/m   Physical Exam  Constitutional: She appears well-developed and well-nourished. No distress.  HENT:  Head: Normocephalic and atraumatic.  Eyes: Conjunctivae are normal.  Neck: Neck supple.  Cardiovascular: Normal rate and regular rhythm.   Pulmonary/Chest: Effort normal.  Neurological: She is alert.  Skin: Skin is warm and dry. She is not diaphoretic.  Psychiatric: She has a normal mood and affect. Her behavior is normal.  Nursing note and vitals reviewed.  ED Treatments / Results  Labs (all labs ordered are listed, but only abnormal results are displayed) Labs Reviewed  POC URINE PREG, ED - Abnormal; Notable for the following:       Result Value   Preg Test, Ur POSITIVE (*)    All other components within normal limits    EKG  EKG Interpretation None       Radiology No results found.  Procedures Procedures  DIAGNOSTIC STUDIES: Oxygen Saturation is 100% on RA, normal by my interpretation.    COORDINATION OF CARE: 12:51 PM Discussed next steps with pt. Pt verbalized understanding and is agreeable with the plan.    Medications Ordered in ED  Medications - No data to display   Initial Impression / Assessment and Plan / ED Course  I have reviewed the triage vital signs and the nursing notes.  Pertinent labs & imaging results that were available during my care of the patient were reviewed by me and considered in my medical decision making (see chart for details).  Clinical Course    Pt presents for a urine pregnancy test. Urine pregnancy test is positive. Supportive care and return precaution discussed. No vaginal bleeding, vaginal discharge or other concerning symptoms. Appears safe for discharge at this time. Patient to follow-up with  OB/GYN. Resources given.   I personally performed the services described in this documentation, which was scribed in my presence. The recorded information has been reviewed and is accurate. Final Clinical Impressions(s) / ED Diagnoses   Final diagnoses:  Positive pregnancy test    New Prescriptions Discharge Medication List as of 11/13/2015 12:52 PM       Anselm PancoastShawn C Uriah Trueba, PA-C 11/15/15 [id number]    Lavera Guiseana Duo Liu, MD 11/16/15 1536

## 2015-11-13 NOTE — MAU Note (Signed)
Pt presents to MAU with complaints of lower abdominal cramping since last night. Denies any vaginal bleeding or abnormal discharge

## 2015-11-14 LAB — CULTURE, OB URINE

## 2015-12-25 ENCOUNTER — Observation Stay
Admission: EM | Admit: 2015-12-25 | Discharge: 2015-12-25 | Disposition: A | Discharge status: Home / Self Care | Payer: Medicaid Other | Attending: Obstetrics & Gynecology | Admitting: Obstetrics & Gynecology

## 2015-12-25 DIAGNOSIS — O4692 Antepartum hemorrhage, unspecified, second trimester: Principal | ICD-10-CM | POA: Insufficient documentation

## 2015-12-25 DIAGNOSIS — Z3A19 19 weeks gestation of pregnancy: Secondary | ICD-10-CM | POA: Insufficient documentation

## 2015-12-25 DIAGNOSIS — O209 Hemorrhage in early pregnancy, unspecified: Secondary | ICD-10-CM | POA: Diagnosis present

## 2015-12-25 NOTE — Discharge Summary (Signed)
Obstetric History and Physical  Colleen Shaw is a 20 y.o. G3P2001 with Estimated Date of Delivery: 05/15/16 per pt's report of US at 14 wks who presents at 2364w5d  presenting for period-like vaginal bleeding this am at 0700, only spotting while up to restroom recently. Patient states she has been having no contractions, intact membranes, with active fetal movement.    Prenatal Course Source of Care: No prenatal care - [facility] Pregnancy complications or risks: Patient Active Problem List   Diagnosis Date Noted  . Vaginal bleeding before [redacted] weeks gestation 12/25/2015  . Active labor at term 04/12/2015  . Pre-eclampsia, severe, delivered 04/12/2015  . H/O postpartum hemorrhage, currently pregnant 04/12/2015  . Chlamydia infection affecting pregnancy 04/12/2015  . Fetal echogenic intracardiac focus on prenatal ultrasound 11/14/2014     Prenatal Transfer Tool   Past Medical History:  Diagnosis Date  . Pregnancy induced hypertension 2015    No past surgical history on file.  OB History  Gravida Para Term Preterm AB Living  3 2 2     1   SAB TAB Ectopic Multiple Live Births        0 1    # Outcome Date GA Lbr Len/2nd Weight Sex Delivery Anes PTL Lv  3 Current           2 Term 04/12/15 258w6d 13:40 / 00:10 6 lb 1 oz (2.75 kg) F Vag-Spont EPI  LIV  1 Term 01/03/14     Vag-Spont         Social History   Social History  . Marital status: Single    Spouse name: N/A  . Number of children: N/A  . Years of education: N/A   Social History Main Topics  . Smoking status: Never Smoker  . Smokeless tobacco: Never Used  . Alcohol use No  . Drug use: No  . Sexual activity: Yes   Other Topics Concern  . Not on file   Social History Narrative  . No narrative on file   FHx: HTN - sister, brother, PGM  No prescriptions prior to admission.    Allergies  Allergen Reactions  . Strawberry Extract Hives and Swelling    Review of Systems:  Negative except for what is mentioned in HPI.  Physical Exam: LMP 08/05/2015 BP 108/61, P 76, T 98 GENERAL: Well-developed, well-nourished female in no acute distress.  LUNGS: unlabored breathing ABDOMEN: Soft, nontender, nondistended, gravid. EXTREMITIES: Nontender, no edema Cervical Exam: Dilatation 0 cm   Effacement 0 %   Station oop   Speculum: light pink/white frothy discharge, wet mount: + whiff, + clue cells FHT: + 160s Contractions: none   Pertinent Labs/Studies:   No results found for this or any previous visit (from the past 24 hour(s)).  Assessment : IUP at 4064w5d, BV, no evidence of severe vaginal bleeding  Plan: Discharge Home  Rx - Flagyl Strongly [facility]. Pt stated she couldn't get visit at [facility] until November and [facility]'t [facility] Pt has active Medicaid, so minimal barrier to getting care. Pt inquiring about US today as she "doesn't know what she's having". Reviewed that routine anatomy ultrasound is best managed through regular prenatal care provider.

## 2015-12-25 NOTE — OB Triage Note (Signed)
Recvd to OBS 3 c/o vaginal bleeding.  Changed to gown and to bed.  Plan of care discussed.

## 2016-01-20 ENCOUNTER — Inpatient Hospital Stay (HOSPITAL_COMMUNITY)
Admission: AD | Admit: 2016-01-20 | Discharge: 2016-01-20 | Discharge status: Against Medical Advice | Payer: Self-pay | Source: Ambulatory Visit | Attending: Obstetrics & Gynecology | Admitting: Obstetrics & Gynecology

## 2016-01-20 ENCOUNTER — Encounter (HOSPITAL_COMMUNITY): Payer: Self-pay

## 2016-01-20 DIAGNOSIS — Z5321 Procedure and treatment not carried out due to patient leaving prior to being seen by health care provider: Secondary | ICD-10-CM | POA: Insufficient documentation

## 2016-01-20 DIAGNOSIS — Z3A23 23 weeks gestation of pregnancy: Secondary | ICD-10-CM | POA: Insufficient documentation

## 2016-01-20 DIAGNOSIS — R109 Unspecified abdominal pain: Secondary | ICD-10-CM | POA: Insufficient documentation

## 2016-01-20 DIAGNOSIS — O26892 Other specified pregnancy related conditions, second trimester: Secondary | ICD-10-CM | POA: Insufficient documentation

## 2016-01-20 HISTORY — DX: Streptococcus, group b, as the cause of diseases classified elsewhere: B95.1

## 2016-01-20 LAB — URINE MICROSCOPIC-ADD ON

## 2016-01-20 LAB — URINALYSIS, ROUTINE W REFLEX MICROSCOPIC
BILIRUBIN URINE: NEGATIVE
GLUCOSE, UA: NEGATIVE mg/dL
HGB URINE DIPSTICK: NEGATIVE
Ketones, ur: NEGATIVE mg/dL
Nitrite: NEGATIVE
PROTEIN: 100 mg/dL — AB
Specific Gravity, Urine: 1.025 (ref 1.005–1.030)
pH: 7.5 (ref 5.0–8.0)

## 2016-01-20 NOTE — MAU Provider Note (Signed)
  History     CSN: [id number]  Arrival date and time: 01/20/16 1300   First Provider Initiated Contact with Patient 01/20/16 1452      Chief Complaint  Patient presents with  . Abdominal Pain  . Pelvic Pain   HPI Ms. Colleen Shaw is a 20 y.o. G3P2001 at 8255w3d who presents to MAU today with complaint of abdominal pain.    OB History    Gravida Para Term Preterm AB Living   3 2 2     1    SAB TAB Ectopic Multiple Live Births         0 1      Past Medical History:  Diagnosis Date  . Pregnancy induced hypertension 2015    History reviewed. No pertinent surgical history.  History reviewed. No pertinent family history.  Social History  Substance Use Topics  . Smoking status: Never Smoker  . Smokeless tobacco: Never Used  . Alcohol use No    Allergies:  Allergies  Allergen Reactions  . Strawberry Extract Hives and Swelling    No prescriptions prior to admission.    ROS Physical Exam   Blood pressure 117/72, pulse 100, temperature 98.4 F (36.9 C), temperature source Oral, resp. rate 18, last menstrual period 08/05/2015, unknown if currently breastfeeding.  Physical Exam  Results for orders placed or performed during the hospital encounter of 01/20/16 (from the past 24 hour(s))  Urinalysis, Routine w reflex microscopic (not at [facility])     Status: Abnormal   Collection Time: 01/20/16  1:19 PM  Result Value Ref Range   Color, Urine YELLOW YELLOW   APPearance CLEAR CLEAR   Specific Gravity, Urine 1.025 1.005 - 1.030   pH 7.5 5.0 - 8.0   Glucose, UA NEGATIVE NEGATIVE mg/dL   Hgb urine dipstick NEGATIVE NEGATIVE   Bilirubin Urine NEGATIVE NEGATIVE   Ketones, ur NEGATIVE NEGATIVE mg/dL   Protein, ur 409100 (A) NEGATIVE mg/dL   Nitrite NEGATIVE NEGATIVE   Leukocytes, UA TRACE (A) NEGATIVE  Urine microscopic-add on     Status: Abnormal   Collection Time: 01/20/16  1:19 PM  Result Value Ref Range   Squamous Epithelial / LPF 6-30 (A) NONE SEEN   WBC, UA 0-5 0 -  5 WBC/hpf   RBC / HPF 0-5 0 - 5 RBC/hpf   Bacteria, UA MANY (A) NONE SEEN   Urine-Other MUCOUS PRESENT    Fetal Monitoring: Unable to obtain EFM  MAU Course  Procedures None  MDM FHR - 148 bpm with doppler UA today  Urine culture ordered Assessment and Plan  SIUP at 7055w3d  Patient left AMA prior to evaluation by the provider   Marny LowensteinJulie N Wenzel, PA-C  01/20/2016, 2:52 PM

## 2016-01-20 NOTE — MAU Note (Signed)
Pt does not go anywhere for prenatal care.

## 2016-01-20 NOTE — MAU Note (Signed)
Pt C/O lower abd & pelvic pain that started last night, was intermittent, is now constant.  Denies bleeding or LOF.

## 2016-01-22 ENCOUNTER — Encounter (HOSPITAL_COMMUNITY): Payer: Self-pay

## 2016-01-22 LAB — CULTURE, OB URINE

## 2016-01-26 ENCOUNTER — Telehealth: Payer: Self-pay | Admitting: Obstetrics and Gynecology

## 2016-01-26 NOTE — Telephone Encounter (Signed)
Patient called to make an appointment to start care. I gave her the first appointment we had available. She wasn't happy with it, so she said she would call back if she needed to.

## 2016-02-19 ENCOUNTER — Ambulatory Visit (INDEPENDENT_AMBULATORY_CARE_PROVIDER_SITE_OTHER): Payer: Self-pay | Admitting: Obstetrics & Gynecology

## 2016-02-19 ENCOUNTER — Encounter: Payer: Self-pay | Admitting: Obstetrics & Gynecology

## 2016-02-19 DIAGNOSIS — Z113 Encounter for screening for infections with a predominantly sexual mode of transmission: Secondary | ICD-10-CM

## 2016-02-19 DIAGNOSIS — Z124 Encounter for screening for malignant neoplasm of cervix: Secondary | ICD-10-CM

## 2016-02-19 DIAGNOSIS — O09292 Supervision of pregnancy with other poor reproductive or obstetric history, second trimester: Secondary | ICD-10-CM

## 2016-02-19 DIAGNOSIS — O09299 Supervision of pregnancy with other poor reproductive or obstetric history, unspecified trimester: Secondary | ICD-10-CM | POA: Insufficient documentation

## 2016-02-19 DIAGNOSIS — O0930 Supervision of pregnancy with insufficient antenatal care, unspecified trimester: Secondary | ICD-10-CM

## 2016-02-19 DIAGNOSIS — O0990 Supervision of high risk pregnancy, unspecified, unspecified trimester: Secondary | ICD-10-CM

## 2016-02-19 LAB — POCT URINALYSIS DIP (DEVICE)
Bilirubin Urine: NEGATIVE
GLUCOSE, UA: NEGATIVE mg/dL
Hgb urine dipstick: NEGATIVE
KETONES UR: NEGATIVE mg/dL
LEUKOCYTES UA: NEGATIVE
Nitrite: NEGATIVE
PROTEIN: NEGATIVE mg/dL
Specific Gravity, Urine: 1.015 (ref 1.005–1.030)
UROBILINOGEN UA: 1 mg/dL (ref 0.0–1.0)
pH: 7 (ref 5.0–8.0)

## 2016-02-19 NOTE — Patient Instructions (Signed)
Third Trimester of Pregnancy The third trimester is from week 29 through week 40 (months 7 through 9). The third trimester is a time when the unborn baby (fetus) is growing rapidly. At the end of the ninth month, the fetus is about 20 inches in length and weighs 6-10 pounds. Body changes during your third trimester Your body goes through many changes during pregnancy. The changes vary from woman to woman. During the third trimester:  Your weight will continue to increase. You can expect to gain 25-35 pounds (11-16 kg) by the end of the pregnancy.  You may begin to get stretch marks on your hips, abdomen, and breasts.  You may urinate more often because the fetus is moving lower into your pelvis and pressing on your bladder.  You may develop or continue to have heartburn. This is caused by increased hormones that slow down muscles in the digestive tract.  You may develop or continue to have constipation because increased hormones slow digestion and cause the muscles that push waste through your intestines to relax.  You may develop hemorrhoids. These are swollen veins (varicose veins) in the rectum that can itch or be painful.  You may develop swollen, bulging veins (varicose veins) in your legs.  You may have increased body aches in the pelvis, back, or thighs. This is due to weight gain and increased hormones that are relaxing your joints.  You may have changes in your hair. These can include thickening of your hair, rapid growth, and changes in texture. Some women also have hair loss during or after pregnancy, or hair that feels dry or thin. Your hair will most likely return to normal after your baby is born.  Your breasts will continue to grow and they will continue to become tender. A yellow fluid (colostrum) may leak from your breasts. This is the first milk you are producing for your baby.  Your belly button may stick out.  You may notice more swelling in your hands, face, or  ankles.  You may have increased tingling or numbness in your hands, arms, and legs. The skin on your belly may also feel numb.  You may feel short of breath because of your expanding uterus.  You may have more problems sleeping. This can be caused by the size of your belly, increased need to urinate, and an increase in your body's metabolism.  You may notice the fetus "dropping," or moving lower in your abdomen.  You may have increased vaginal discharge.  Your cervix becomes thin and soft (effaced) near your due date. What to expect at prenatal visits You will have prenatal exams every 2 weeks until week 36. Then you will have weekly prenatal exams. During a routine prenatal visit:  You will be weighed to make sure you and the fetus are growing normally.  Your blood pressure will be taken.  Your abdomen will be measured to track your baby's growth.  The fetal heartbeat will be listened to.  Any test results from the previous visit will be discussed.  You may have a cervical check near your due date to see if you have effaced. At around 36 weeks, your health care provider will check your cervix. At the same time, your health care provider will also perform a test on the secretions of the vaginal tissue. This test is to determine if a type of bacteria, Group B streptococcus, is present. Your health care provider will explain this further. Your health care provider may ask you:    What your birth plan is.  How you are feeling.  If you are feeling the baby move.  If you have had any abnormal symptoms, such as leaking fluid, bleeding, severe headaches, or abdominal cramping.  If you are using any tobacco products, including cigarettes, chewing tobacco, and electronic cigarettes.  If you have any questions. Other tests or screenings that may be performed during your third trimester include:  Blood tests that check for low iron levels (anemia).  Fetal testing to check the health,  activity level, and growth of the fetus. Testing is done if you have certain medical conditions or if there are problems during the pregnancy.  Nonstress test (NST). This test checks the health of your baby to make sure there are no signs of problems, such as the baby not getting enough oxygen. During this test, a belt is placed around your belly. The baby is made to move, and its heart rate is monitored during movement. What is false labor? False labor is a condition in which you feel small, irregular tightenings of the muscles in the womb (contractions) that eventually go away. These are called Braxton Hicks contractions. Contractions may last for hours, days, or even weeks before true labor sets in. If contractions come at regular intervals, become more frequent, increase in intensity, or become painful, you should see your health care provider. What are the signs of labor?  Abdominal cramps.  Regular contractions that start at 10 minutes apart and become stronger and more frequent with time.  Contractions that start on the top of the uterus and spread down to the lower abdomen and back.  Increased pelvic pressure and dull back pain.  A watery or bloody mucus discharge that comes from the vagina.  Leaking of amniotic fluid. This is also known as your "water breaking." It could be a slow trickle or a gush. Let your doctor know if it has a color or strange odor. If you have any of these signs, call your health care provider right away, even if it is before your due date. Follow these instructions at home: Eating and drinking  Continue to eat regular, healthy meals.  Do not eat:  Raw meat or meat spreads.  Unpasteurized milk or cheese.  Unpasteurized juice.  Store-made salad.  Refrigerated smoked seafood.  Hot dogs or deli meat, unless they are piping hot.  More than 6 ounces of albacore tuna a week.  Shark, swordfish, king mackerel, or tile fish.  Store-made salads.  Raw  sprouts, such as mung bean or alfalfa sprouts.  Take prenatal vitamins as told by your health care provider.  Take 1000 mg of calcium daily as told by your health care provider.  If you develop constipation:  Take over-the-counter or prescription medicines.  Drink enough fluid to keep your urine clear or pale yellow.  Eat foods that are high in fiber, such as fresh fruits and vegetables, whole grains, and beans.  Limit foods that are high in fat and processed sugars, such as fried and sweet foods. Activity  Exercise only as directed by your health care provider. Healthy pregnant women should aim for 2 hours and 30 minutes of moderate exercise per week. If you experience any pain or discomfort while exercising, stop.  Avoid heavy lifting.  Do not exercise in extreme heat or humidity, or at high altitudes.  Wear low-heel, comfortable shoes.  Practice good posture.  Do not travel far distances unless it is absolutely necessary and only with the approval   of your health care provider.  Wear your seat belt at all times while in a car, on a bus, or on a plane.  Take frequent breaks and rest with your legs elevated if you have leg cramps or low back pain.  Do not use hot tubs, steam rooms, or saunas.  You may continue to have sex unless your health care provider tells you otherwise. Lifestyle  Do not use any products that contain nicotine or tobacco, such as cigarettes and e-cigarettes. If you need help quitting, ask your health care provider.  Do not drink alcohol.  Do not use any medicinal herbs or unprescribed drugs. These chemicals affect the formation and growth of the baby.  If you develop varicose veins:  Wear support pantyhose or compression stockings as told by your healthcare provider.  Elevate your feet for 15 minutes, 3-4 times a day.  Wear a supportive maternity bra to help with breast tenderness. General instructions  Take over-the-counter and prescription  medicines only as told by your health care provider. There are medicines that are either safe or unsafe to take during pregnancy.  Take warm sitz baths to soothe any pain or discomfort caused by hemorrhoids. Use hemorrhoid cream or witch hazel if your health care provider approves.  Avoid cat litter boxes and soil used by cats. These carry germs that can cause birth defects in the baby. If you have a cat, ask someone to clean the litter box for you.  To prepare for the arrival of your baby:  Take prenatal classes to understand, practice, and ask questions about the labor and delivery.  Make a trial run to the hospital.  Visit the hospital and tour the maternity area.  Arrange for maternity or paternity leave through employers.  Arrange for family and friends to take care of pets while you are in the hospital.  Purchase a rear-facing car seat and make sure you know how to install it in your car.  Pack your hospital bag.  Prepare the baby's nursery. Make sure to remove all pillows and stuffed animals from the baby's crib to prevent suffocation.  Visit your dentist if you have not gone during your pregnancy. Use a soft toothbrush to brush your teeth and be gentle when you floss.  Keep all prenatal follow-up visits as told by your health care provider. This is important. Contact a health care provider if:  You are unsure if you are in labor or if your water has broken.  You become dizzy.  You have mild pelvic cramps, pelvic pressure, or nagging pain in your abdominal area.  You have lower back pain.  You have persistent nausea, vomiting, or diarrhea.  You have an unusual or bad smelling vaginal discharge.  You have pain when you urinate. Get help right away if:  You have a fever.  You are leaking fluid from your vagina.  You have spotting or bleeding from your vagina.  You have severe abdominal pain or cramping.  You have rapid weight loss or weight gain.  You have  shortness of breath with chest pain.  You notice sudden or extreme swelling of your face, hands, ankles, feet, or legs.  Your baby makes fewer than 10 movements in 2 hours.  You have severe headaches that do not go away with medicine.  You have vision changes. Summary  The third trimester is from week 29 through week 40, months 7 through 9. The third trimester is a time when the unborn baby (fetus)   is growing rapidly.  During the third trimester, your discomfort may increase as you and your baby continue to gain weight. You may have abdominal, leg, and back pain, sleeping problems, and an increased need to urinate.  During the third trimester your breasts will keep growing and they will continue to become tender. A yellow fluid (colostrum) may leak from your breasts. This is the first milk you are producing for your baby.  False labor is a condition in which you feel small, irregular tightenings of the muscles in the womb (contractions) that eventually go away. These are called Braxton Hicks contractions. Contractions may last for hours, days, or even weeks before true labor sets in.  Signs of labor can include: abdominal cramps; regular contractions that start at 10 minutes apart and become stronger and more frequent with time; watery or bloody mucus discharge that comes from the vagina; increased pelvic pressure and dull back pain; and leaking of amniotic fluid. This information is not intended to replace advice given to you by your health care provider. Make sure you discuss any questions you have with your health care provider. Document Released: 02/09/2001 Document Revised: 07/24/2015 Document Reviewed: 04/18/2012 Elsevier Interactive Patient Education  2017 [employer]  

## 2016-02-19 NOTE — Progress Notes (Signed)
Prenatal info packets given Patient not fasting for 2hr gtt Initial prenatal labs today Declines flu and tdap

## 2016-02-20 LAB — GC/CHLAMYDIA PROBE AMP (~~LOC~~) NOT AT ARMC
Chlamydia: POSITIVE — AB
NEISSERIA GONORRHEA: POSITIVE — AB

## 2016-02-20 LAB — PRENATAL PROFILE (SOLSTAS)
ANTIBODY SCREEN: NEGATIVE
BASOS ABS: 0 {cells}/uL (ref 0–200)
BASOS PCT: 0 %
EOS PCT: 2 %
Eosinophils Absolute: 362 cells/uL (ref 15–500)
HEMATOCRIT: 28 % — AB (ref 35.0–45.0)
HEMOGLOBIN: 9.3 g/dL — AB (ref 11.7–15.5)
HEP B S AG: NEGATIVE
HIV 1&2 Ab, 4th Generation: NONREACTIVE
LYMPHS ABS: 2353 {cells}/uL (ref 850–3900)
Lymphocytes Relative: 13 %
MCH: 27.9 pg (ref 27.0–33.0)
MCHC: 33.2 g/dL (ref 32.0–36.0)
MCV: 84.1 fL (ref 80.0–100.0)
MONOS PCT: 10 %
MPV: 9.3 fL (ref 7.5–12.5)
Monocytes Absolute: 1810 cells/uL — ABNORMAL HIGH (ref 200–950)
NEUTROS ABS: 13575 {cells}/uL — AB (ref 1500–7800)
Neutrophils Relative %: 75 %
Platelets: 354 10*3/uL (ref 140–400)
RBC: 3.33 MIL/uL — AB (ref 3.80–5.10)
RDW: 15.2 % — ABNORMAL HIGH (ref 11.0–15.0)
RUBELLA: 4.58 {index} — AB (ref <0.90)
Rh Type: POSITIVE
WBC: 18.1 10*3/uL — AB (ref 3.8–10.8)

## 2016-02-20 LAB — HEMOGLOBINOPATHY EVALUATION
HCT: 28 % — ABNORMAL LOW (ref 35.0–45.0)
HEMOGLOBIN: 9.3 g/dL — AB (ref 11.7–15.5)
HGB A2 QUANT: 2.4 % (ref 1.8–3.5)
HGB A: 96.6 % (ref >96.0)
Hgb F Quant: <1 % (ref <2.0)
MCH: 27.9 pg (ref 27.0–33.0)
MCV: 84.1 fL (ref 80.0–100.0)
RBC: 3.33 MIL/uL — AB (ref 3.80–5.10)
RDW: 15.2 % — AB (ref 11.0–15.0)

## 2016-02-20 LAB — PAIN MGMT, PROFILE 6 CONF W/O MM, U
6 Acetylmorphine: NEGATIVE ng/mL (ref <10)
Alcohol Metabolites: NEGATIVE ng/mL (ref <500)
Amphetamines: NEGATIVE ng/mL (ref <500)
BARBITURATES: NEGATIVE ng/mL (ref <300)
BENZODIAZEPINES: NEGATIVE ng/mL (ref <100)
COCAINE METABOLITE: NEGATIVE ng/mL (ref <150)
CREATININE: 153.2 mg/dL (ref >=20.0)
METHADONE METABOLITE: NEGATIVE ng/mL (ref <100)
Marijuana Metabolite: NEGATIVE ng/mL (ref <20)
OPIATES: NEGATIVE ng/mL (ref <100)
OXYCODONE: NEGATIVE ng/mL (ref <100)
Oxidant: NEGATIVE ug/mL (ref <200)
PHENCYCLIDINE: NEGATIVE ng/mL (ref <25)
Please note:: 0
pH: 7.18 (ref 4.5–9.0)

## 2016-02-20 LAB — CYTOLOGY - PAP: Diagnosis: NEGATIVE

## 2016-02-20 LAB — CULTURE, OB URINE: Organism ID, Bacteria: NO GROWTH

## 2016-02-25 ENCOUNTER — Telehealth: Payer: Self-pay | Admitting: *Deleted

## 2016-02-25 ENCOUNTER — Ambulatory Visit (INDEPENDENT_AMBULATORY_CARE_PROVIDER_SITE_OTHER): Payer: Self-pay | Admitting: *Deleted

## 2016-02-25 DIAGNOSIS — O98313 Other infections with a predominantly sexual mode of transmission complicating pregnancy, third trimester: Secondary | ICD-10-CM

## 2016-02-25 DIAGNOSIS — A749 Chlamydial infection, unspecified: Secondary | ICD-10-CM

## 2016-02-25 DIAGNOSIS — O98213 Gonorrhea complicating pregnancy, third trimester: Secondary | ICD-10-CM

## 2016-02-25 DIAGNOSIS — O98813 Other maternal infectious and parasitic diseases complicating pregnancy, third trimester: Principal | ICD-10-CM

## 2016-02-25 MED ORDER — AZITHROMYCIN 250 MG PO TABS
1000.0000 mg | ORAL_TABLET | Freq: Once | ORAL | Status: AC
  Administered 2016-02-25: 1000 mg via ORAL

## 2016-02-25 MED ORDER — CEFTRIAXONE SODIUM 250 MG IJ SOLR
250.0000 mg | Freq: Once | INTRAMUSCULAR | Status: AC
  Administered 2016-02-25: 250 mg via INTRAMUSCULAR

## 2016-02-25 NOTE — Telephone Encounter (Signed)
Patient has chlamydia and gonorrhea. Called and informed patient. Advised her to come in asap for treatment. Patient stated she will be here this morning.

## 2016-02-25 NOTE — Progress Notes (Signed)
Patient presents to clinic for treatment of gc/chlamydia infection. 1gm azithromycin administered orally, 250mg  rocephin im given. Patient tolerated well. Advised patient to refrain from intercourse for 7 days after both her and her partner get treated. Partner may go to the [facility] for treatment. Patient voiced understanding. STD card sent.

## 2016-03-01 NOTE — L&D Delivery Note (Signed)
Delivery Note At 4:10 PM a viable female was delivered via Vaginal, Spontaneous Delivery (presentation: vertex ; OA ).  APGAR: 9, 9; weight pending .   Placenta status: spontaneously delivered, intact.  Cord: 3 vessels.  Cord pH: n/a  Anesthesia:  Epidural Episiotomy: None Lacerations: None Suture Repair: none needed Est. Blood Loss (mL): 300 Augmentation: AROM @1416  Mom to postpartum.  Baby to Couplet care / Skin to Skin.  Nehemiah SettleAna Carvalho do Silvio ClaymanAmaral MD PGY1 05/02/2016, 4:52 PM

## 2016-03-02 ENCOUNTER — Encounter (HOSPITAL_COMMUNITY): Payer: Self-pay

## 2016-03-02 ENCOUNTER — Ambulatory Visit (HOSPITAL_COMMUNITY)
Admission: RE | Admit: 2016-03-02 | Discharge: 2016-03-02 | Disposition: A | Discharge status: Home / Self Care | Payer: Self-pay | Source: Ambulatory Visit | Attending: Obstetrics & Gynecology | Admitting: Obstetrics & Gynecology

## 2016-03-02 ENCOUNTER — Other Ambulatory Visit (HOSPITAL_COMMUNITY): Payer: Self-pay | Admitting: *Deleted

## 2016-03-02 ENCOUNTER — Other Ambulatory Visit: Payer: Self-pay | Admitting: Obstetrics & Gynecology

## 2016-03-02 DIAGNOSIS — Z3A28 28 weeks gestation of pregnancy: Secondary | ICD-10-CM | POA: Insufficient documentation

## 2016-03-02 DIAGNOSIS — O0933 Supervision of pregnancy with insufficient antenatal care, third trimester: Secondary | ICD-10-CM

## 2016-03-02 DIAGNOSIS — Z363 Encounter for antenatal screening for malformations: Secondary | ICD-10-CM | POA: Insufficient documentation

## 2016-03-02 DIAGNOSIS — O09293 Supervision of pregnancy with other poor reproductive or obstetric history, third trimester: Secondary | ICD-10-CM | POA: Insufficient documentation

## 2016-03-02 DIAGNOSIS — O09893 Supervision of other high risk pregnancies, third trimester: Secondary | ICD-10-CM | POA: Insufficient documentation

## 2016-03-02 DIAGNOSIS — Z3A30 30 weeks gestation of pregnancy: Secondary | ICD-10-CM

## 2016-03-02 DIAGNOSIS — O0990 Supervision of high risk pregnancy, unspecified, unspecified trimester: Secondary | ICD-10-CM

## 2016-03-04 ENCOUNTER — Encounter: Payer: Self-pay | Admitting: Obstetrics and Gynecology

## 2016-03-30 ENCOUNTER — Encounter (HOSPITAL_COMMUNITY): Payer: Self-pay

## 2016-03-30 ENCOUNTER — Ambulatory Visit (HOSPITAL_COMMUNITY)
Admission: RE | Admit: 2016-03-30 | Discharge: 2016-03-30 | Disposition: A | Discharge status: Home / Self Care | Payer: Self-pay | Source: Ambulatory Visit | Attending: Obstetrics & Gynecology | Admitting: Obstetrics & Gynecology

## 2016-04-26 ENCOUNTER — Inpatient Hospital Stay (HOSPITAL_COMMUNITY)
Admission: AD | Admit: 2016-04-26 | Discharge: 2016-04-26 | Disposition: A | Discharge status: Home / Self Care | Payer: Self-pay | Source: Ambulatory Visit | Attending: Obstetrics and Gynecology | Admitting: Obstetrics and Gynecology

## 2016-04-26 ENCOUNTER — Encounter (HOSPITAL_COMMUNITY): Payer: Self-pay

## 2016-04-26 DIAGNOSIS — O0933 Supervision of pregnancy with insufficient antenatal care, third trimester: Secondary | ICD-10-CM | POA: Insufficient documentation

## 2016-04-26 DIAGNOSIS — O479 False labor, unspecified: Secondary | ICD-10-CM

## 2016-04-26 DIAGNOSIS — O99013 Anemia complicating pregnancy, third trimester: Secondary | ICD-10-CM | POA: Insufficient documentation

## 2016-04-26 DIAGNOSIS — Z3A35 35 weeks gestation of pregnancy: Secondary | ICD-10-CM | POA: Insufficient documentation

## 2016-04-26 DIAGNOSIS — D649 Anemia, unspecified: Secondary | ICD-10-CM | POA: Insufficient documentation

## 2016-04-26 DIAGNOSIS — O4703 False labor before 37 completed weeks of gestation, third trimester: Secondary | ICD-10-CM | POA: Insufficient documentation

## 2016-04-26 LAB — COMPREHENSIVE METABOLIC PANEL
ALBUMIN: 2.8 g/dL — AB (ref 3.5–5.0)
ALT: 7 U/L — ABNORMAL LOW (ref 14–54)
ANION GAP: 9 (ref 5–15)
AST: 22 U/L (ref 15–41)
Alkaline Phosphatase: 232 U/L — ABNORMAL HIGH (ref 38–126)
BILIRUBIN TOTAL: 0.5 mg/dL (ref 0.3–1.2)
BUN: 7 mg/dL (ref 6–20)
CO2: 23 mmol/L (ref 22–32)
Calcium: 8.3 mg/dL — ABNORMAL LOW (ref 8.9–10.3)
Chloride: 105 mmol/L (ref 101–111)
Creatinine, Ser: 0.56 mg/dL (ref 0.44–1.00)
GFR calc Af Amer: >60 mL/min (ref >60)
GLUCOSE: 107 mg/dL — AB (ref 65–99)
POTASSIUM: 3.2 mmol/L — AB (ref 3.5–5.1)
Sodium: 137 mmol/L (ref 135–145)
TOTAL PROTEIN: 6.9 g/dL (ref 6.5–8.1)

## 2016-04-26 LAB — URINALYSIS, ROUTINE W REFLEX MICROSCOPIC
BILIRUBIN URINE: NEGATIVE
Glucose, UA: NEGATIVE mg/dL
Hgb urine dipstick: NEGATIVE
KETONES UR: NEGATIVE mg/dL
Leukocytes, UA: NEGATIVE
NITRITE: NEGATIVE
PH: 7 (ref 5.0–8.0)
PROTEIN: NEGATIVE mg/dL
Specific Gravity, Urine: 1.009 (ref 1.005–1.030)

## 2016-04-26 LAB — CBC
HEMATOCRIT: 24.3 % — AB (ref 36.0–46.0)
HEMOGLOBIN: 8 g/dL — AB (ref 12.0–15.0)
MCH: 26.3 pg (ref 26.0–34.0)
MCHC: 32.9 g/dL (ref 30.0–36.0)
MCV: 79.9 fL (ref 78.0–100.0)
Platelets: 398 10*3/uL (ref 150–400)
RBC: 3.04 MIL/uL — ABNORMAL LOW (ref 3.87–5.11)
RDW: 14.6 % (ref 11.5–15.5)
WBC: 13 10*3/uL — AB (ref 4.0–10.5)

## 2016-04-26 LAB — WET PREP, GENITAL
Sperm: NONE SEEN
Trich, Wet Prep: NONE SEEN
Yeast Wet Prep HPF POC: NONE SEEN

## 2016-04-26 LAB — PROTEIN / CREATININE RATIO, URINE
Creatinine, Urine: 99 mg/dL
Protein Creatinine Ratio: 0.1 mg/mg{Cre} (ref 0.00–0.15)
TOTAL PROTEIN, URINE: 10 mg/dL

## 2016-04-26 LAB — RAPID URINE DRUG SCREEN, HOSP PERFORMED
Amphetamines: NOT DETECTED
BENZODIAZEPINES: NOT DETECTED
Barbiturates: NOT DETECTED
COCAINE: NOT DETECTED
Opiates: NOT DETECTED
Tetrahydrocannabinol: NOT DETECTED

## 2016-04-26 MED ORDER — FERROUS SULFATE 325 (65 FE) MG PO TABS: 325.0000 mg | ORAL_TABLET | Freq: Two times a day (BID) | ORAL | 0 refills | Status: DC

## 2016-04-26 NOTE — Discharge Instructions (Signed)
Braxton Hicks Contractions °Contractions of the uterus can occur throughout pregnancy. Contractions are not always a sign that you are in labor.  °WHAT ARE BRAXTON HICKS CONTRACTIONS?  °Contractions that occur before labor are called Braxton Hicks contractions, or false labor. Toward the end of pregnancy (32-34 weeks), these contractions can develop more often and may become more forceful. This is not true labor because these contractions do not result in opening (dilatation) and thinning of the cervix. They are sometimes difficult to tell apart from true labor because these contractions can be forceful and people have different pain tolerances. You should not feel embarrassed if you go to the hospital with false labor. Sometimes, the only way to tell if you are in true labor is for your health care provider to look for changes in the cervix. °If there are no prenatal problems or other health problems associated with the pregnancy, it is completely safe to be sent home with false labor and await the onset of true labor. °HOW CAN YOU TELL THE DIFFERENCE BETWEEN TRUE AND FALSE LABOR? °False Labor  °· The contractions of false labor are usually shorter and not as hard as those of true labor.   °· The contractions are usually irregular.   °· The contractions are often felt in the front of the lower abdomen and in the groin.   °· The contractions may go away when you walk around or change positions while lying down.   °· The contractions get weaker and are shorter lasting as time goes on.   °· The contractions do not usually become progressively stronger, regular, and closer together as with true labor.   °True Labor  °· Contractions in true labor last 30-70 seconds, become very regular, usually become more intense, and increase in frequency.   °· The contractions do not go away with walking.   °· The discomfort is usually felt in the top of the uterus and spreads to the lower abdomen and low back.   °· True labor can be  determined by your health care provider with an exam. This will show that the cervix is dilating and getting thinner.   °WHAT TO REMEMBER °· Keep up with your usual exercises and follow other instructions given by your health care provider.   °· Take medicines as directed by your health care provider.   °· Keep your regular prenatal appointments.   °· Eat and drink lightly if you think you are going into labor.   °· If Braxton Hicks contractions are making you uncomfortable:   °¨ Change your position from lying down or resting to walking, or from walking to resting.   °¨ Sit and rest in a tub of warm water.   °¨ Drink 2-3 glasses of water. Dehydration may cause these contractions.   °¨ Do slow and deep breathing several times an hour.   °WHEN SHOULD I SEEK IMMEDIATE MEDICAL CARE? °Seek immediate medical care if: °· Your contractions become stronger, more regular, and closer together.   °· You have fluid leaking or gushing from your vagina.   °· You have a fever.   °· You pass blood-tinged mucus.   °· You have vaginal bleeding.   °· You have continuous abdominal pain.   °· You have low back pain that you never had before.   °· You feel your baby's head pushing down and causing pelvic pressure.   °· Your baby is not moving as much as it used to.   °This information is not intended to replace advice given to you by your health care provider. Make sure you discuss any questions you have with your health care   provider. Document Released: 02/15/2005 Document Revised: 06/09/2015 Document Reviewed: 11/27/2012 Elsevier Interactive Patient Education  2017 [employer] Anemia, Nonspecific Anemia is a condition in which the concentration of red blood cells or hemoglobin in the blood is below normal. Hemoglobin is a substance in red blood cells that carries oxygen to the tissues of the body. Anemia results in not enough oxygen reaching these tissues. What are the causes? Common causes of anemia include:  Excessive  bleeding. Bleeding may be internal or external. This includes excessive bleeding from periods (in women) or from the intestine.  Poor nutrition.  Chronic kidney, thyroid, and liver disease.  Bone marrow disorders that decrease red blood cell production.  Cancer and treatments for cancer.  HIV, AIDS, and their treatments.  Spleen problems that increase red blood cell destruction.  Blood disorders.  Excess destruction of red blood cells due to infection, medicines, and autoimmune disorders. What are the signs or symptoms?  Minor weakness.  Dizziness.  Headache.  Palpitations.  Shortness of breath, especially with exercise.  Paleness.  Cold sensitivity.  Indigestion.  Nausea.  Difficulty sleeping.  Difficulty concentrating. Symptoms may occur suddenly or they may develop slowly. How is this diagnosed? Additional blood tests are often needed. These help your health care provider determine the best treatment. Your health care provider will check your stool for blood and look for other causes of blood loss. How is this treated? Treatment varies depending on the cause of the anemia. Treatment can include:  Supplements of iron, vitamin B12, or folic acid.  Hormone medicines.  A blood transfusion. This may be needed if blood loss is severe.  Hospitalization. This may be needed if there is significant continual blood loss.  Dietary changes.  Spleen removal. Follow these instructions at home: Keep all follow-up appointments. It often takes many weeks to correct anemia, and having your health care provider check on your condition and your response to treatment is very important. Get help right away if:  You develop extreme weakness, shortness of breath, or chest pain.  You become dizzy or have trouble concentrating.  You develop heavy vaginal bleeding.  You develop a rash.  You have bloody or black, tarry stools.  You faint.  You vomit up blood.  You  vomit repeatedly.  You have abdominal pain.  You have a fever or persistent symptoms for more than 2-3 days.  You have a fever and your symptoms suddenly get worse.  You are dehydrated. This information is not intended to replace advice given to you by your health care provider. Make sure you discuss any questions you have with your health care provider. Document Released: 03/25/2004 Document Revised: 07/30/2015 Document Reviewed: 08/11/2012 Elsevier Interactive Patient Education  2017 [employer]

## 2016-04-26 NOTE — [facility] Provider Note (Signed)
History     CSN: [id number]  Arrival date and time: 04/26/16 1330   First Provider Initiated Contact with Patient 04/26/16 1410      Chief Complaint  Patient presents with  . Contractions   HPI   [name] is a 21 y.o. female G31P2002 @ 62w6dwith very limited prenatal care here in [facility] with contractions. The contractions started a few weeks ago. She is feeling them every single day. The contractions are painful when they come and she feels pressure in between the contractions.  No bleeding. + fetal movements. No intercourse in the last 24 hours.   Induced last pregnancy due to preeclampsia.   OB History    Gravida Para Term Preterm AB Living   '3 2 2     2   ' SAB TAB Ectopic Multiple Live Births         0 2      Past Medical History:  Diagnosis Date  . Group beta Strep positive   . Pregnancy induced hypertension 2015    Past Surgical History:  Procedure Laterality Date  . NO PAST SURGERIES      Family History  Problem Relation Age of Onset  . Hypertension Paternal Grandmother     Social History  Substance Use Topics  . Smoking status: Never Smoker  . Smokeless tobacco: Never Used  . Alcohol use No    Allergies:  Allergies  Allergen Reactions  . Strawberry Extract Hives and Swelling    No prescriptions prior to admission.   Results for orders placed or performed during the hospital encounter of 04/26/16 (from the past 48 hour(s))  Urinalysis, Routine w reflex microscopic     Status: None   Collection Time: 04/26/16  1:50 PM  Result Value Ref Range   Color, Urine YELLOW YELLOW   APPearance CLEAR CLEAR   Specific Gravity, Urine 1.009 1.005 - 1.030   pH 7.0 5.0 - 8.0   Glucose, UA NEGATIVE NEGATIVE mg/dL   Hgb urine dipstick NEGATIVE NEGATIVE   Bilirubin Urine NEGATIVE NEGATIVE   Ketones, ur NEGATIVE NEGATIVE mg/dL   Protein, ur NEGATIVE NEGATIVE mg/dL   Nitrite NEGATIVE NEGATIVE   Leukocytes, UA NEGATIVE NEGATIVE  Protein / creatinine ratio,  urine     Status: None   Collection Time: 04/26/16  1:50 PM  Result Value Ref Range   Creatinine, Urine 99.00 mg/dL   Total Protein, Urine 10 mg/dL    Comment: NO NORMAL RANGE ESTABLISHED FOR THIS TEST   Protein Creatinine Ratio 0.10 0.00 - 0.15 mg/mg[Cre]  Urine rapid drug screen (hosp performed)     Status: None   Collection Time: 04/26/16  1:50 PM  Result Value Ref Range   Opiates NONE DETECTED NONE DETECTED   Cocaine NONE DETECTED NONE DETECTED   Benzodiazepines NONE DETECTED NONE DETECTED   Amphetamines NONE DETECTED NONE DETECTED   Tetrahydrocannabinol NONE DETECTED NONE DETECTED   Barbiturates NONE DETECTED NONE DETECTED    Comment:        DRUG SCREEN FOR MEDICAL PURPOSES ONLY.  IF CONFIRMATION IS NEEDED FOR ANY PURPOSE, NOTIFY LAB WITHIN 5 DAYS.        LOWEST DETECTABLE LIMITS FOR URINE DRUG SCREEN Drug Class       Cutoff (ng/mL) Amphetamine      1000 Barbiturate      200 Benzodiazepine   2638Tricyclics       3937Opiates          300 Cocaine  300 THC              50   Comprehensive metabolic panel     Status: Abnormal   Collection Time: 04/26/16  2:17 PM  Result Value Ref Range   Sodium 137 135 - 145 mmol/L   Potassium 3.2 (L) 3.5 - 5.1 mmol/L   Chloride 105 101 - 111 mmol/L   CO2 23 22 - 32 mmol/L   Glucose, Bld 107 (H) 65 - 99 mg/dL   BUN 7 6 - 20 mg/dL   Creatinine, Ser 0.56 0.44 - 1.00 mg/dL   Calcium 8.3 (L) 8.9 - 10.3 mg/dL   Total Protein 6.9 6.5 - 8.1 g/dL   Albumin 2.8 (L) 3.5 - 5.0 g/dL   AST 22 15 - 41 U/L   ALT 7 (L) 14 - 54 U/L   Alkaline Phosphatase 232 (H) 38 - 126 U/L   Total Bilirubin 0.5 0.3 - 1.2 mg/dL   GFR calc non Af Amer >60 >60 mL/min   GFR calc Af Amer >60 >60 mL/min    Comment: (NOTE) The eGFR has been calculated using the CKD EPI equation. This calculation has not been validated in all clinical situations. eGFR's persistently <60 mL/min signify possible Chronic Kidney Disease.    Anion gap 9 5 - 15  CBC     Status:  Abnormal   Collection Time: 04/26/16  2:17 PM  Result Value Ref Range   WBC 13.0 (H) 4.0 - 10.5 K/uL   RBC 3.04 (L) 3.87 - 5.11 MIL/uL   Hemoglobin 8.0 (L) 12.0 - 15.0 g/dL   HCT 24.3 (L) 36.0 - 46.0 %   MCV 79.9 78.0 - 100.0 fL   MCH 26.3 26.0 - 34.0 pg   MCHC 32.9 30.0 - 36.0 g/dL   RDW 14.6 11.5 - 15.5 %   Platelets 398 150 - 400 K/uL  Wet prep, genital     Status: Abnormal   Collection Time: 04/26/16  2:29 PM  Result Value Ref Range   Yeast Wet Prep HPF POC NONE SEEN NONE SEEN   Trich, Wet Prep NONE SEEN NONE SEEN   Clue Cells Wet Prep HPF POC PRESENT (A) NONE SEEN   WBC, Wet Prep HPF POC FEW (A) NONE SEEN    Comment: MODERATE BACTERIA SEEN   Sperm NONE SEEN     Review of Systems  Constitutional: Negative for fever.  Eyes: Negative for photophobia and visual disturbance.  Genitourinary: Negative for dysuria and vaginal discharge.  Neurological: Negative for headaches.   Physical Exam   Blood pressure 131/84, pulse 81, temperature 98.6 F (37 C), temperature source Oral, resp. rate 16, weight 154 lb 8 oz (70.1 kg), last menstrual period 08/05/2015, unknown if currently breastfeeding.  Physical Exam  Constitutional: She is oriented to person, place, and time. She appears well-developed and well-nourished. No distress.  HENT:  Head: Normocephalic.  Eyes: Pupils are equal, round, and reactive to light.  Respiratory: Effort normal.  GI: Soft. There is no tenderness.  Genitourinary:  Genitourinary Comments: Dilation: Fingertip Effacement (%): Thick Cervical Position: Posterior Station: -3 Exam by:: J. Rasch NP  Musculoskeletal: Normal range of motion.  Neurological: She is alert and oriented to person, place, and time.  Skin: Skin is warm. She is not diaphoretic.  Psychiatric: Her behavior is normal.   Fetal Tracing: Baseline: 130 bpm  Variability: Moderate  Accelerations: 15x15 Decelerations: None Toco: none  MAU Course  Procedures  None  MDM  Wet prep  &  GC CBC  CMP PCR GBS collected  Clue cells on wet prep, patient not symptomatic   Assessment and Plan    A:  1. Braxton Hicks contractions   2. Anemia in pregnancy, third trimester     P:  Discharge home in stable condition Message sent to the [facility] for follow up; close follow up recommended. Patient highly encouraged to follow up with [facility] Preeclampsia precautions Return to [facility] if symptoms worsen Rx: Iron  Preterm labor pecautions   Lezlie Lye, NP 04/26/2016 6:01 PM

## 2016-04-26 NOTE — [facility] Note (Signed)
Been having a lot of contractions, and having a lot of pressure.  Been going on this whole month, getting worse. Now every 2-3 min. No leaking or bleeding

## 2016-04-27 LAB — GC/CHLAMYDIA PROBE AMP (~~LOC~~) NOT AT ARMC
CHLAMYDIA, DNA PROBE: NEGATIVE
NEISSERIA GONORRHEA: NEGATIVE

## 2016-04-28 ENCOUNTER — Ambulatory Visit (INDEPENDENT_AMBULATORY_CARE_PROVIDER_SITE_OTHER): Payer: Self-pay | Admitting: Clinical

## 2016-04-28 ENCOUNTER — Encounter: Payer: Self-pay | Admitting: Obstetrics and Gynecology

## 2016-04-28 ENCOUNTER — Ambulatory Visit (INDEPENDENT_AMBULATORY_CARE_PROVIDER_SITE_OTHER): Payer: Self-pay | Admitting: Medical

## 2016-04-28 VITALS — BP 126/86 | HR 99 | Wt 153.3 lb

## 2016-04-28 DIAGNOSIS — O0993 Supervision of high risk pregnancy, unspecified, third trimester: Secondary | ICD-10-CM

## 2016-04-28 DIAGNOSIS — F4323 Adjustment disorder with mixed anxiety and depressed mood: Secondary | ICD-10-CM

## 2016-04-28 DIAGNOSIS — O0990 Supervision of high risk pregnancy, unspecified, unspecified trimester: Secondary | ICD-10-CM

## 2016-04-28 LAB — CULTURE, BETA STREP (GROUP B ONLY)

## 2016-04-28 NOTE — Patient Instructions (Signed)
Fetal Movement Counts Patient Name: ________________________________________________ Patient Due Date: ____________________ What is a fetal movement count? A fetal movement count is the number of times that you feel your baby move during a certain amount of time. This may also be called a fetal kick count. A fetal movement count is recommended for every pregnant woman. You may be asked to start counting fetal movements as early as week 28 of your pregnancy. Pay attention to when your baby is most active. You may notice your baby's sleep and wake cycles. You may also notice things that make your baby move more. You should do a fetal movement count:  When your baby is normally most active.  At the same time each day. A good time to count movements is while you are resting, after having something to eat and drink. How do I count fetal movements? 1. Find a quiet, comfortable area. Sit, or lie down on your side. 2. Write down the date, the start time and stop time, and the number of movements that you felt between those two times. Take this information with you to your health care visits. 3. For 2 hours, count kicks, flutters, swishes, rolls, and jabs. You should feel at least 10 movements during 2 hours. 4. You may stop counting after you have felt 10 movements. 5. If you do not feel 10 movements in 2 hours, have something to eat and drink. Then, keep resting and counting for 1 hour. If you feel at least 4 movements during that hour, you may stop counting. Contact a health care provider if:  You feel fewer than 4 movements in 2 hours.  Your baby is not moving like he or she usually does. Date: ____________ Start time: ____________ Stop time: ____________ Movements: ____________ Date: ____________ Start time: ____________ Stop time: ____________ Movements: ____________ Date: ____________ Start time: ____________ Stop time: ____________ Movements: ____________ Date: ____________ Start time:  ____________ Stop time: ____________ Movements: ____________ Date: ____________ Start time: ____________ Stop time: ____________ Movements: ____________ Date: ____________ Start time: ____________ Stop time: ____________ Movements: ____________ Date: ____________ Start time: ____________ Stop time: ____________ Movements: ____________ Date: ____________ Start time: ____________ Stop time: ____________ Movements: ____________ Date: ____________ Start time: ____________ Stop time: ____________ Movements: ____________ This information is not intended to replace advice given to you by your health care provider. Make sure you discuss any questions you have with your health care provider. Document Released: 03/17/2006 Document Revised: 10/15/2015 Document Reviewed: 03/27/2015 Elsevier Interactive Patient Education  2017 [employer] Braxton Hicks Contractions Contractions of the uterus can occur throughout pregnancy, but they are not always a sign that you are in labor. You may have practice contractions called Braxton Hicks contractions. These false labor contractions are sometimes confused with true labor. What are Braxton Hicks contractions? Braxton Hicks contractions are tightening movements that occur in the muscles of the uterus before labor. Unlike true labor contractions, these contractions do not result in opening (dilation) and thinning of the cervix. Toward the end of pregnancy (32-34 weeks), Braxton Hicks contractions can happen more often and may become stronger. These contractions are sometimes difficult to tell apart from true labor because they can be very uncomfortable. You should not feel embarrassed if you go to the hospital with false labor. Sometimes, the only way to tell if you are in true labor is for your health care provider to look for changes in the cervix. The health care provider will do a physical exam and may monitor your contractions. If you   are not in true labor, the exam  should show that your cervix is not dilating and your water has not broken. If there are no prenatal problems or other health problems associated with your pregnancy, it is completely safe for you to be sent home with false labor. You may continue to have Braxton Hicks contractions until you go into true labor. How can I tell the difference between true labor and false labor?  Differences  False labor  Contractions last 30-70 seconds.: Contractions are usually shorter and not as strong as true labor contractions.  Contractions become very regular.: Contractions are usually irregular.  Discomfort is usually felt in the top of the uterus, and it spreads to the lower abdomen and low back.: Contractions are often felt in the front of the lower abdomen and in the groin.  Contractions do not go away with walking.: Contractions may go away when you walk around or change positions while lying down.  Contractions usually become more intense and increase in frequency.: Contractions get weaker and are shorter-lasting as time goes on.  The cervix dilates and gets thinner.: The cervix usually does not dilate or become thin. Follow these instructions at home:  Take over-the-counter and prescription medicines only as told by your health care provider.  Keep up with your usual exercises and follow other instructions from your health care provider.  Eat and drink lightly if you think you are going into labor.  If Braxton Hicks contractions are making you uncomfortable:  Change your position from lying down or resting to walking, or change from walking to resting.  Sit and rest in a tub of warm water.  Drink enough fluid to keep your urine clear or pale yellow. Dehydration may cause these contractions.  Do slow and deep breathing several times an hour.  Keep all follow-up prenatal visits as told by your health care provider. This is important. Contact a health care provider if:  You have a  fever.  You have continuous pain in your abdomen. Get help right away if:  Your contractions become stronger, more regular, and closer together.  You have fluid leaking or gushing from your vagina.  You pass blood-tinged mucus (bloody show).  You have bleeding from your vagina.  You have low back pain that you never had before.  You feel your baby's head pushing down and causing pelvic pressure.  Your baby is not moving inside you as much as it used to. Summary  Contractions that occur before labor are called Braxton Hicks contractions, false labor, or practice contractions.  Braxton Hicks contractions are usually shorter, weaker, farther apart, and less regular than true labor contractions. True labor contractions usually become progressively stronger and regular and they become more frequent.  Manage discomfort from Braxton Hicks contractions by changing position, resting in a warm bath, drinking plenty of water, or practicing deep breathing. This information is not intended to replace advice given to you by your health care provider. Make sure you discuss any questions you have with your health care provider. Document Released: 02/15/2005 Document Revised: 01/05/2016 Document Reviewed: 01/05/2016 Elsevier Interactive Patient Education  2017 [employer] Inc.  

## 2016-04-28 NOTE — Progress Notes (Signed)
   PRENATAL VISIT NOTE  Subjective:  Colleen Shaw is a 21 y.o. G3P2002 at 4364w1d being seen today for ongoing prenatal care.  She is currently monitored for the following issues for this high-risk pregnancy and has Vaginal bleeding before [redacted] weeks gestation; Supervision of high risk pregnancy, antepartum; Hx of preeclampsia, prior pregnancy, currently pregnant; and Late prenatal care affecting pregnancy, antepartum on her problem list.  Patient reports no complaints.  Contractions: Irregular. Vag. Bleeding: None.  Movement: Present. Denies leaking of fluid.   The following portions of the patient's history were reviewed and updated as appropriate: allergies, current medications, past family history, past medical history, past social history, past surgical history and problem list. Problem list updated.  Objective:   Vitals:   04/28/16 1444  BP: 126/86  Pulse: 99  Weight: 153 lb 4.8 oz (69.5 kg)    Fetal Status: Fetal Heart Rate (bpm): 136 Fundal Height: 36 cm Movement: Present     General:  Alert, oriented and cooperative. Patient is in no acute distress.  Skin: Skin is warm and dry. No rash noted.   Cardiovascular: Normal heart rate noted  Respiratory: Normal respiratory effort, no problems with respiration noted  Abdomen: Soft, gravid, appropriate for gestational age. Pain/Pressure: Present     Pelvic:  Cervical exam deferred        Extremities: Normal range of motion.  Edema: None  Mental Status: Normal mood and affect. Normal behavior. Normal judgment and thought content.   Assessment and Plan:  Pregnancy: G3P2002 at 6064w1d  1. Supervision of high risk pregnancy, antepartum - Very limited prenatal care, patient states due to issues with Medicaid - Patient doing well, may desire BTL but Medicaid is not active yet  Preterm labor symptoms and general obstetric precautions including but not limited to vaginal bleeding, contractions, leaking of fluid and fetal movement were  reviewed in detail with the patient. Please refer to After Visit Summary for other counseling recommendations.  Return in about 1 week (around 05/05/2016) for LOB.   Marny LowensteinJulie N Naftoli Penny, PA-C

## 2016-04-28 NOTE — BH Specialist Note (Signed)
Session Start time: 3:25  End Time: 3:45 Total Time:  20 minutes Type of Service: Behavioral Health - Individual/Family Interpreter: No.   Interpreter Name & Language: n/a # [facility]-June 2018[facility]  SUBJECTIVE: Colleen Shaw is a 21 y.o. female  Pt. was referred by Vonzella NippleJulie Wenzel, PA-C for:  Psychosocial . Pt. reports the following symptoms/concerns: Pt states that her primary concern today is that she "wants to get this baby out", and wants to get a tubal, but has not applied for Medicaid yet. Pt says she might need to talk to someone after the baby is born, because she thinks it is possible she has experienced postpartum depression after one of her children, but denies any anxiety or depression today.  Duration of problem:  Over two months  Severity: mild Previous treatment: none  OBJECTIVE: Mood: Depressed & Affect: Inappropriate Risk of harm to self or others: No known risk of harm to self or others Assessments administered: PHQ9:5/GAD7:6 (Higher at last visit)  LIFE CONTEXT:  Family & Social: Lives with husband and two children, ages 341yo and 2yo School/ Work: Undetermined  Self-Care: Social media when she cannot go back to sleep  Life changes: Current pregnancy, uninsured in pregnancy What is important to pt/family (values): Giving birth asap  GOALS ADDRESSED:  -Reduce symptoms of anxiety and depression  INTERVENTIONS: Motivational Interviewing   ASSESSMENT:  Pt currently experiencing Adjustment disorder with anxiety and depressed mood.  Pt may benefit from psychoeducation and brief therapeutic intervention regarding coping with symptoms of anxiety and depression.   PLAN: 1. F/U with behavioral health clinician: Two weeks, or as needed 2. Behavioral Health meds: none 3. Behavioral recommendations:  [facility] for Medicaid -Consider phone apps discussed in office visit for additional self-coping strategy -Read  educational material regarding coping with symptoms of anxiety and depression -Consider Postpartum Planner as resource for planning for postpartum time, including Mom Talk support group 4. Referral: Brief Counseling/Psychotherapy, Publishing rights managerCommunity Resource and Psychoeducation 5. From scale of 1-10, how likely are you to follow plan: 5  Woc-Behavioral Health Clinician  Behavioral Health Clinician  Marlon PelWarmhandoff:   Warm Hand Off Completed.        Depression screen [facility] 2[facility]9 04/28/2016 02/25/2016 02/19/2016  Decreased Interest 1 1 1   Down, Depressed, Hopeless 1 1 1   PHQ - 2 Score 2 2 2   Altered sleeping 1 1 3   Tired, decreased energy 1 2 3   Change in appetite 1 1 2   Feeling bad or failure about yourself  0 1 1  Trouble concentrating 0 0 0  Moving slowly or fidgety/restless 0 0 0  Suicidal thoughts 0 0 0  PHQ-9 Score 5 7 11    GAD 7 : Generalized Anxiety Score 04/28/2016 02/25/2016 02/19/2016  Nervous, Anxious, on Edge 1 1 3   Control/stop worrying 1 1 2   Worry too much - different things 2 1 2   Trouble relaxing 1 2 2   Restless 0 2 0  Easily annoyed or irritable 1 3 3   Afraid - awful might happen 0 0 0  Total GAD 7 Score 6 10 12

## 2016-05-02 ENCOUNTER — Inpatient Hospital Stay (HOSPITAL_COMMUNITY): Payer: Medicaid Other | Admitting: Anesthesiology

## 2016-05-02 ENCOUNTER — Inpatient Hospital Stay (HOSPITAL_COMMUNITY)
Admission: AD | Admit: 2016-05-02 | Discharge: 2016-05-04 | DRG: 775 | Disposition: A | Discharge status: Home / Self Care | Payer: Medicaid Other | Source: Ambulatory Visit | Attending: Obstetrics and Gynecology | Admitting: Obstetrics and Gynecology

## 2016-05-02 ENCOUNTER — Encounter (HOSPITAL_COMMUNITY): Payer: Self-pay | Admitting: *Deleted

## 2016-05-02 DIAGNOSIS — O99824 Streptococcus B carrier state complicating childbirth: Principal | ICD-10-CM | POA: Diagnosis present

## 2016-05-02 DIAGNOSIS — Z3A36 36 weeks gestation of pregnancy: Secondary | ICD-10-CM

## 2016-05-02 DIAGNOSIS — O134 Gestational [pregnancy-induced] hypertension without significant proteinuria, complicating childbirth: Secondary | ICD-10-CM

## 2016-05-02 DIAGNOSIS — Z8249 Family history of ischemic heart disease and other diseases of the circulatory system: Secondary | ICD-10-CM | POA: Diagnosis not present

## 2016-05-02 LAB — CBC
HCT: 25.4 % — ABNORMAL LOW (ref 36.0–46.0)
HCT: 24.5 % — ABNORMAL LOW (ref 36.0–46.0)
HEMOGLOBIN: 8.3 g/dL — AB (ref 12.0–15.0)
Hemoglobin: 8.1 g/dL — ABNORMAL LOW (ref 12.0–15.0)
MCH: 26.3 pg (ref 26.0–34.0)
MCH: 26.4 pg (ref 26.0–34.0)
MCHC: 32.7 g/dL (ref 30.0–36.0)
MCHC: 33.1 g/dL (ref 30.0–36.0)
MCV: 80.4 fL (ref 78.0–100.0)
MCV: 79.8 fL (ref 78.0–100.0)
PLATELETS: 359 10*3/uL (ref 150–400)
Platelets: 403 10*3/uL — ABNORMAL HIGH (ref 150–400)
RBC: 3.16 MIL/uL — ABNORMAL LOW (ref 3.87–5.11)
RBC: 3.07 MIL/uL — AB (ref 3.87–5.11)
RDW: 14.6 % (ref 11.5–15.5)
RDW: 14.8 % (ref 11.5–15.5)
WBC: 13.9 10*3/uL — AB (ref 4.0–10.5)
WBC: 18.4 10*3/uL — AB (ref 4.0–10.5)

## 2016-05-02 LAB — URINALYSIS, ROUTINE W REFLEX MICROSCOPIC
BILIRUBIN URINE: NEGATIVE
Glucose, UA: NEGATIVE mg/dL
KETONES UR: NEGATIVE mg/dL
NITRITE: NEGATIVE
PROTEIN: NEGATIVE mg/dL
Specific Gravity, Urine: 1.01 (ref 1.005–1.030)
pH: 7 (ref 5.0–8.0)

## 2016-05-02 LAB — PROTEIN / CREATININE RATIO, URINE
Creatinine, Urine: 35 mg/dL
Protein Creatinine Ratio: 0.2 mg/mg{Cre} — ABNORMAL HIGH (ref 0.00–0.15)
TOTAL PROTEIN, URINE: 7 mg/dL

## 2016-05-02 LAB — COMPREHENSIVE METABOLIC PANEL
ALT: 9 U/L — AB (ref 14–54)
AST: 22 U/L (ref 15–41)
Albumin: 2.7 g/dL — ABNORMAL LOW (ref 3.5–5.0)
Alkaline Phosphatase: 250 U/L — ABNORMAL HIGH (ref 38–126)
Anion gap: 8 (ref 5–15)
BILIRUBIN TOTAL: 0.6 mg/dL (ref 0.3–1.2)
BUN: 9 mg/dL (ref 6–20)
CO2: 21 mmol/L — ABNORMAL LOW (ref 22–32)
CREATININE: 0.59 mg/dL (ref 0.44–1.00)
Calcium: 8.4 mg/dL — ABNORMAL LOW (ref 8.9–10.3)
Chloride: 106 mmol/L (ref 101–111)
GFR calc Af Amer: >60 mL/min (ref >60)
Glucose, Bld: 77 mg/dL (ref 65–99)
POTASSIUM: 3.7 mmol/L (ref 3.5–5.1)
Sodium: 135 mmol/L (ref 135–145)
TOTAL PROTEIN: 6.7 g/dL (ref 6.5–8.1)

## 2016-05-02 LAB — TYPE AND SCREEN
ABO/RH(D): O POS
ANTIBODY SCREEN: NEGATIVE

## 2016-05-02 MED ORDER — PHENYLEPHRINE 40 MCG/ML (10ML) SYRINGE FOR IV PUSH (FOR BLOOD PRESSURE SUPPORT)
80.0000 ug | PREFILLED_SYRINGE | INTRAVENOUS | Status: DC | PRN
  Filled 2016-05-02: qty 10
  Filled 2016-05-02: qty 5

## 2016-05-02 MED ORDER — EPHEDRINE 5 MG/ML INJ
10.0000 mg | INTRAVENOUS | Status: DC | PRN
  Filled 2016-05-02: qty 4

## 2016-05-02 MED ORDER — DIPHENHYDRAMINE HCL 25 MG PO CAPS: 25.0000 mg | ORAL_CAPSULE | Freq: Four times a day (QID) | ORAL | Status: DC | PRN

## 2016-05-02 MED ORDER — ONDANSETRON HCL 4 MG/2ML IJ SOLN: 4.0000 mg | Freq: Four times a day (QID) | INTRAMUSCULAR | Status: DC | PRN

## 2016-05-02 MED ORDER — ACETAMINOPHEN 325 MG PO TABS: 650.0000 mg | ORAL_TABLET | ORAL | Status: DC | PRN

## 2016-05-02 MED ORDER — SODIUM CHLORIDE 0.9 % IV SOLN: 14.0000 ug/h | INTRAVENOUS | Status: DC

## 2016-05-02 MED ORDER — ONDANSETRON HCL 4 MG/2ML IJ SOLN: 4.0000 mg | INTRAMUSCULAR | Status: DC | PRN

## 2016-05-02 MED ORDER — METHYLERGONOVINE MALEATE 0.2 MG/ML IJ SOLN
INTRAMUSCULAR | Status: AC
  Administered 2016-05-02: 0.2 mg
  Filled 2016-05-02: qty 1

## 2016-05-02 MED ORDER — PHENYLEPHRINE 40 MCG/ML (10ML) SYRINGE FOR IV PUSH (FOR BLOOD PRESSURE SUPPORT)
80.0000 ug | PREFILLED_SYRINGE | INTRAVENOUS | Status: DC | PRN
  Filled 2016-05-02: qty 5
  Filled 2016-05-02: qty 10

## 2016-05-02 MED ORDER — HYDRALAZINE HCL 20 MG/ML IJ SOLN
10.0000 mg | Freq: Once | INTRAMUSCULAR | Status: AC
  Administered 2016-05-02: 10 mg via INTRAVENOUS

## 2016-05-02 MED ORDER — LACTATED RINGERS IV SOLN
INTRAVENOUS | Status: DC
  Administered 2016-05-02 (×2): via INTRAVENOUS

## 2016-05-02 MED ORDER — BENZOCAINE-MENTHOL 20-0.5 % EX AERO: 1.0000 "application " | INHALATION_SPRAY | CUTANEOUS | Status: DC | PRN

## 2016-05-02 MED ORDER — SENNOSIDES-DOCUSATE SODIUM 8.6-50 MG PO TABS
2.0000 | ORAL_TABLET | ORAL | Status: DC
  Administered 2016-05-02 – 2016-05-03 (×2): 2 via ORAL
  Filled 2016-05-02 (×2): qty 2

## 2016-05-02 MED ORDER — OXYTOCIN BOLUS FROM INFUSION
500.0000 mL | Freq: Once | INTRAVENOUS | Status: AC
  Administered 2016-05-02: 500 mL via INTRAVENOUS

## 2016-05-02 MED ORDER — LIDOCAINE HCL (PF) 1 % IJ SOLN
30.0000 mL | INTRAMUSCULAR | Status: DC | PRN
  Filled 2016-05-02: qty 30

## 2016-05-02 MED ORDER — OXYCODONE-ACETAMINOPHEN 5-325 MG PO TABS: 2.0000 | ORAL_TABLET | ORAL | Status: DC | PRN

## 2016-05-02 MED ORDER — SODIUM CHLORIDE 0.9 % IV SOLN
2.0000 g | Freq: Four times a day (QID) | INTRAVENOUS | Status: DC
  Administered 2016-05-02: 2 g via INTRAVENOUS
  Filled 2016-05-02 (×3): qty 2000

## 2016-05-02 MED ORDER — FERROUS SULFATE 325 (65 FE) MG PO TABS
325.0000 mg | ORAL_TABLET | Freq: Two times a day (BID) | ORAL | Status: DC
  Administered 2016-05-03 – 2016-05-04 (×3): 325 mg via ORAL
  Filled 2016-05-02 (×3): qty 1

## 2016-05-02 MED ORDER — OXYTOCIN 40 UNITS IN LACTATED RINGERS INFUSION - SIMPLE MED
2.5000 [IU]/h | INTRAVENOUS | Status: DC
  Filled 2016-05-02: qty 1000

## 2016-05-02 MED ORDER — COCONUT OIL OIL: 1.0000 "application " | TOPICAL_OIL | Status: DC | PRN

## 2016-05-02 MED ORDER — HYDRALAZINE HCL 20 MG/ML IJ SOLN
INTRAMUSCULAR | Status: AC
  Filled 2016-05-02: qty 1

## 2016-05-02 MED ORDER — HYDRALAZINE HCL 20 MG/ML IJ SOLN
10.0000 mg | Freq: Once | INTRAMUSCULAR | Status: AC
  Administered 2016-05-02: 10 mg via INTRAMUSCULAR

## 2016-05-02 MED ORDER — IBUPROFEN 600 MG PO TABS
600.0000 mg | ORAL_TABLET | Freq: Four times a day (QID) | ORAL | Status: DC
  Administered 2016-05-02 – 2016-05-04 (×5): 600 mg via ORAL
  Filled 2016-05-02 (×8): qty 1

## 2016-05-02 MED ORDER — WITCH HAZEL-GLYCERIN EX PADS: 1.0000 "application " | MEDICATED_PAD | CUTANEOUS | Status: DC | PRN

## 2016-05-02 MED ORDER — ONDANSETRON HCL 4 MG PO TABS: 4.0000 mg | ORAL_TABLET | ORAL | Status: DC | PRN

## 2016-05-02 MED ORDER — SOD CITRATE-CITRIC ACID 500-334 MG/5ML PO SOLN: 30.0000 mL | ORAL | Status: DC | PRN

## 2016-05-02 MED ORDER — METHYLERGONOVINE MALEATE 0.2 MG/ML IJ SOLN: 0.2000 mg | INTRAMUSCULAR | Status: DC | PRN

## 2016-05-02 MED ORDER — LIDOCAINE HCL (PF) 1 % IJ SOLN
INTRAMUSCULAR | Status: DC | PRN
  Administered 2016-05-02: 6 mL via EPIDURAL
  Administered 2016-05-02: 4 mL

## 2016-05-02 MED ORDER — LACTATED RINGERS IV SOLN: 500.0000 mL | INTRAVENOUS | Status: DC | PRN

## 2016-05-02 MED ORDER — PRENATAL MULTIVITAMIN CH
1.0000 | ORAL_TABLET | Freq: Every day | ORAL | Status: DC
  Administered 2016-05-03: 1 via ORAL
  Filled 2016-05-02: qty 1

## 2016-05-02 MED ORDER — DIPHENHYDRAMINE HCL 50 MG/ML IJ SOLN: 12.5000 mg | INTRAMUSCULAR | Status: DC | PRN

## 2016-05-02 MED ORDER — LACTATED RINGERS IV SOLN: 500.0000 mL | Freq: Once | INTRAVENOUS | Status: DC

## 2016-05-02 MED ORDER — SIMETHICONE 80 MG PO CHEW: 80.0000 mg | CHEWABLE_TABLET | ORAL | Status: DC | PRN

## 2016-05-02 MED ORDER — METHYLERGONOVINE MALEATE 0.2 MG PO TABS: 0.2000 mg | ORAL_TABLET | ORAL | Status: DC | PRN

## 2016-05-02 MED ORDER — TETANUS-DIPHTH-ACELL PERTUSSIS 5-2.5-18.5 LF-MCG/0.5 IM SUSP: 0.5000 mL | Freq: Once | INTRAMUSCULAR | Status: DC

## 2016-05-02 MED ORDER — FENTANYL 2.5 MCG/ML BUPIVACAINE 1/10 % EPIDURAL INFUSION (WH - ANES)
14.0000 mL/h | INTRAMUSCULAR | Status: DC | PRN
  Administered 2016-05-02: 14 mL/h via EPIDURAL
  Filled 2016-05-02: qty 100

## 2016-05-02 MED ORDER — ZOLPIDEM TARTRATE 5 MG PO TABS: 5.0000 mg | ORAL_TABLET | Freq: Every evening | ORAL | Status: DC | PRN

## 2016-05-02 MED ORDER — ACETAMINOPHEN 325 MG PO TABS
650.0000 mg | ORAL_TABLET | ORAL | Status: DC | PRN
Start: 2016-05-02 — End: 2016-05-04

## 2016-05-02 MED ORDER — DIBUCAINE 1 % RE OINT: 1.0000 "application " | TOPICAL_OINTMENT | RECTAL | Status: DC | PRN

## 2016-05-02 MED ORDER — OXYCODONE-ACETAMINOPHEN 5-325 MG PO TABS: 1.0000 | ORAL_TABLET | ORAL | Status: DC | PRN

## 2016-05-02 NOTE — Anesthesia Preprocedure Evaluation (Signed)
Anesthesia Evaluation  Patient identified by MRN, date of birth, ID band Patient awake    Reviewed: Allergy & Precautions, H&P , Patient's Chart, lab work & pertinent test results  Airway Mallampati: II  TM Distance: >3 FB Neck ROM: full    Dental  (+) Teeth Intact   Pulmonary  breath sounds clear to auscultation        Cardiovascular hypertension, Rhythm:regular Rate:Normal     Neuro/Psych    GI/Hepatic   Endo/Other    Renal/GU      Musculoskeletal   Abdominal   Peds  Hematology  (+) anemia ,   Anesthesia Other Findings       Reproductive/Obstetrics (+) Pregnancy                             Anesthesia Physical Anesthesia Plan  ASA: II  Anesthesia Plan: Epidural   Post-op Pain Management:    Induction:   Airway Management Planned:   Additional Equipment:   Intra-op Plan:   Post-operative Plan:   Informed Consent: I have reviewed the patients History and Physical, chart, labs and discussed the procedure including the risks, benefits and alternatives for the proposed anesthesia with the patient or authorized representative who has indicated his/her understanding and acceptance.   Dental Advisory Given  Plan Discussed with:   Anesthesia Plan Comments: (Labs checked- platelets confirmed with RN in room. Fetal heart tracing, per RN, reported to be stable enough for sitting procedure. Discussed epidural, and patient consents to the procedure:  included risk of possible headache,backache, failed block, allergic reaction, and nerve injury. This patient was asked if she had any questions or concerns before the procedure started.)        Anesthesia Quick Evaluation  

## 2016-05-02 NOTE — Anesthesia Procedure Notes (Signed)
Epidural Patient location during procedure: OB  Staffing Anesthesiologist: Colleen Shaw, Colleen Shaw  Preanesthetic Checklist Completed: patient identified, site marked, surgical consent, pre-op evaluation, timeout performed, IV checked, risks and benefits discussed and monitors and equipment checked  Epidural Patient position: sitting Prep: site prepped and draped and DuraPrep Patient monitoring: continuous pulse ox and blood pressure Approach: midline Location: L3-L4 Injection technique: LOR air  Needle:  Needle type: Tuohy  Needle gauge: 17 G Needle length: 9 cm and 9 Needle insertion depth: 6 cm Catheter type: closed end flexible Catheter size: 19 Gauge Catheter at skin depth: 12 cm Test dose: negative  Assessment Events: blood not aspirated, injection not painful, no injection resistance, negative IV test and no paresthesia  Additional Notes Scoliosis to the [place]efty Dosing of Epidural:  1st dose, through catheter ............................................Marland Kitchen.  Xylocaine 40 mg  2nd dose, through catheter, after waiting 3 minutes........Marland Kitchen.Xylocaine 60 mg    As each dose occurred, patient was free of IV sx; and patient exhibited no evidence of SA injection.  Patient is more comfortable after epidural dosed. Please see RN's note for documentation of vital signs,and FHR which are stable.  Patient reminded not to try to ambulate with numb legs, and that an RN must be present when she attempts to get up.

## 2016-05-02 NOTE — H&P (Signed)
Colleen Shaw is a 21 y.o. female 743P2002 @ 36.5 wks  presenting for active labor. OB History    Gravida Para Term Preterm AB Living   3 2 2     2    SAB TAB Ectopic Multiple Live Births         0 2     Past Medical History:  Diagnosis Date  . Group beta Strep positive   . Pregnancy induced hypertension 2015   Past Surgical History:  Procedure Laterality Date  . NO PAST SURGERIES     Family History: family history includes Hypertension in her paternal grandmother. Social History:  reports that she has never smoked. She has never used smokeless tobacco. She reports that she does not drink alcohol or use drugs.     Maternal Diabetes: No Genetic Screening: Normal Maternal Ultrasounds/Referrals: Normal Fetal Ultrasounds or other Referrals:  None Maternal Substance Abuse:  No Significant Maternal Medications:  None Significant Maternal Lab Results:  Lab values include: Group B Strep positive Other Comments:  None  ROS Maternal Medical History:  Reason for admission: Contractions.   Contractions: Onset was 3-5 hours ago.   Frequency: regular.   Perceived severity is moderate.    Fetal activity: Perceived fetal activity is normal.   Last perceived fetal movement was within the past hour.    Prenatal complications: Pre-eclampsia.   Prenatal Complications - Diabetes: none.    Dilation: 6 Effacement (%): 100 Exam by:: k fields, rn Blood pressure (!) 144/96, pulse (!) 105, temperature 98.8 F (37.1 C), temperature source Oral, resp. rate 18, height 5\' 1"  (1.549 m), weight 151 lb (68.5 kg), last menstrual period 08/05/2015, unknown if currently breastfeeding. Maternal Exam:  Uterine Assessment: Contraction strength is moderate.  Contraction frequency is regular.   Abdomen: Patient reports no abdominal tenderness. Fetal presentation: vertex  Introitus: Normal vulva. Normal vagina.  Ferning test: not done.  Nitrazine test: not done.  Pelvis: adequate for delivery.    Cervix: Cervix evaluated by digital exam.     Fetal Exam Fetal Monitor Review: Mode: ultrasound.   Variability: moderate (6-25 bpm).   Pattern: accelerations present.    Fetal State Assessment: Category I - tracings are normal.     Physical Exam  Constitutional: She is oriented to person, place, and time. She appears well-developed and well-nourished.  HENT:  Head: Normocephalic.  Eyes: Pupils are equal, round, and reactive to light.  Neck: Normal range of motion.  Cardiovascular: Normal rate, regular rhythm, normal heart sounds and intact distal pulses.   Respiratory: Effort normal and breath sounds normal.  GI: Soft. Bowel sounds are normal.  Genitourinary: Vagina normal and uterus normal.  Musculoskeletal: Normal range of motion.  Neurological: She is alert and oriented to person, place, and time. She has normal reflexes.  Skin: Skin is warm and dry.  Psychiatric: She has a normal mood and affect. Her behavior is normal. Judgment and thought content normal.    Prenatal labs: ABO, Rh: --/--/O POS (03/04 0912) Antibody: NEG (03/04 0912) Rubella: 4.58 (12/21 1058) RPR: NON REAC (12/21 1058)  HBsAg: NEGATIVE (12/21 1058)  HIV: NONREACTIVE (12/21 1058)  GBS:     Assessment/Plan: Admit GBS prophylaxis PIH labs     Colleen Shaw 05/02/2016, 12:18 PM

## 2016-05-02 NOTE — MAU Note (Signed)
Patient presents with c/o pelvic pain and pressure and contractions which kept her up all night

## 2016-05-02 NOTE — Progress Notes (Signed)
Colleen Shaw is a 21 y.o. G3P2002 at 6070w5d by ultrasound admitted for active labor  Subjective:   Objective: BP (!) 144/88   Pulse (!) 104   Temp 98.8 F (37.1 C) (Oral)   Resp 18   Ht 5\' 1"  (1.549 m)   Wt 151 lb (68.5 kg)   LMP 08/05/2015 (Approximate)   BMI 28.53 kg/m  No intake/output data recorded. No intake/output data recorded.  FHT:  FHR: 150's bpm, variability: moderate,  accelerations:  Present,  decelerations:  Absent UC:   regular, every 3-4 minutes SVE:   Dilation: 8 Effacement (%): 100 Exam by:: Dariella Gillihan, cnm  Labs: Lab Results  Component Value Date   WBC 13.9 (H) 05/02/2016   HGB 8.3 (L) 05/02/2016   HCT 25.4 (L) 05/02/2016   MCV 80.4 05/02/2016   PLT 403 (H) 05/02/2016    Assessment / Plan: Spontaneous labor, progressing normally  Labor: Progressing normally Preeclampsia:  no signs or symptoms of toxicity, intake and ouput balanced and labs stable Fetal Wellbeing:  Category I Pain Control:  Epidural I/D:  n/a Anticipated MOD:  NSVD  Colleen Shaw 05/02/2016, 2:31 PM

## 2016-05-02 NOTE — Progress Notes (Signed)
G3P2 @ 36.[redacted] wksga. Presents to triage for ctx. "kept her up all night". Denies LOF or bleeding. +FM. EFM applied. VSS. See flow sheet for details.   MD notified. Report status of pt given.

## 2016-05-03 LAB — CBC
HCT: 23.9 % — ABNORMAL LOW (ref 36.0–46.0)
Hemoglobin: 7.9 g/dL — ABNORMAL LOW (ref 12.0–15.0)
MCH: 26.4 pg (ref 26.0–34.0)
MCHC: 33.1 g/dL (ref 30.0–36.0)
MCV: 79.9 fL (ref 78.0–100.0)
PLATELETS: 352 10*3/uL (ref 150–400)
RBC: 2.99 MIL/uL — ABNORMAL LOW (ref 3.87–5.11)
RDW: 14.8 % (ref 11.5–15.5)
WBC: 25.8 10*3/uL — AB (ref 4.0–10.5)

## 2016-05-03 LAB — RPR: RPR: NONREACTIVE

## 2016-05-03 NOTE — Anesthesia Postprocedure Evaluation (Signed)
Anesthesia Post Note  Patient: Colleen Shaw  Procedure(s) Performed: * No procedures listed *  Patient location during evaluation: Mother Baby Anesthesia Type: Epidural Level of consciousness: awake and alert Pain management: pain level controlled Vital Signs Assessment: post-procedure vital signs reviewed and stable Respiratory status: spontaneous breathing, nonlabored ventilation and respiratory function stable Cardiovascular status: stable Postop Assessment: no headache, no backache and epidural receding Anesthetic complications: no        Last Vitals:  Vitals:   05/02/16 2345 05/03/16 0529  BP: 126/80 (!) 145/100  Pulse: 100 70  Resp: 18 18  Temp: 37.7 C 36.5 C    Last Pain:  Vitals:   05/03/16 0529  TempSrc: Other (Comment)  PainSc:    Pain Goal:                 Jose Alleyne

## 2016-05-03 NOTE — Progress Notes (Signed)
CLINICAL SOCIAL WORK MATERNAL/CHILD NOTE  Patient Details  Name: Colleen Shaw MRN: [id number] Date of Birth: 05/02/2018  Date:  05/03/2016  Clinical Social Worker Initiating Note:  Terri Piedra, [place] Date/ Time Initiated:  05/03/16/1530     Child's Name:  Colleen Shaw   Legal Guardian:   (Parents: [name] and [name])   Need for Interpreter:      Date of Referral:  05/03/16     Reason for Referral:  Late or No Prenatal Care  (2 prenatal visits.)   Referral Source:  [facility]   Address:  [address] [place], [place], [place] [address]  Phone number:  [phone]   Household Members:  Minor Children, Significant Other (Couple has two daughters at home, ages 83 and 1.)   Natural Supports (not living in the home):  Extended Family (MOB reports that her grandmother and aunt are very supportive and currently caring for her two other children while she is in the hospital with baby.)   Professional Supports: None   Employment:     Type of Work: MOB was working at [employer] in [employer] through a [employer], and plans to look for work after a few weeks home with baby.   Education:      Pensions consultant:   (MOB's facesheet notes Medicaid Potential.  [facility]B [facility].  CSW contacted [facility] [place] per MOB's request.)   Other Resources:      Cultural/Religious Considerations Which May Impact Care: None stated.  Strengths:  Ability to meet basic needs , Pediatrician chosen , Home prepared for child  Conservation officer, nature)   Risk Factors/Current Problems:  None   Cognitive State:  Able to Concentrate , Alert , Linear Thinking , Insightful , Goal Oriented    Mood/Affect:  Flat , Calm    CSW Assessment: CSW met with MOB in her first floor room/133 to offer support and complete assessment due to limited PNC.  MOB presented with a flat affect, but informed CSW that she is extremely tired.  CSW offered to return in the morning,  but MOB asked CSW to stay, stating, "let's just get this over with." MOB reports she and FOB are in a relationship and that they have 2 other daughters at home, ages 38 and 57.  She notes that she is "going to be very busy" when asked how she feels about having another baby.  CSW did not have a chance to see MOB interact with baby, as baby was asleep in bassinet throughout entire assessment.   CSW inquired about MOB's relationship with FOB and who her supports are.  MOB reports stress in the relationship between her and FOB, but states they are working on their relationship.  She feels she has the resources needed to work on their relationship and denies any resources offered by CSW.  She reports things are fine at this time and that an increase in communication between them has helped tremendously.  MOB states her aunt and grandmother are great supports for her. CSW asked MOB about her mental health hx, especially after the births of her other babies and throughout this pregnancy.  She reports feeling fine emotionally during pregnancy and now, but states PPD after her first delivery.  When asked about her specific symptoms she notes, "I was just emotional."  CSW asked how long her symptoms lasted and she replied, "a couple weeks."  She states she did not tell her doctor of seek treatment  and the symptoms went away.  CSW provided education regarding the "Baby Blues" period as well as PMADs and encouraged MOB to talk with a medical professional if she has concerns about her mental health at any time.  CSW informed MOB that there are Behavioral Health Clinicians at both the pediatric office and her OB.  MOB stated understanding.  She does not feel she needs any mental health resources from CSW at this time.  She was receptive to CSW recommendation to complete the "New Mom Checklist" as a way to self-evaluate periodically during the postpartum time period.  CSW asked about MOB's prenatal care and record that she had  only two visits.  MOB reports not having Medicaid and therefore "no one would take me in [place]."  She states had came [facility].  CSW asked if she has Medicaid now to ensure there are no barriers to pediatric follow up for baby.  MOB states she is unsure and would like to speak with a financial counselor here at the hospital.  [place] called [facility] [place].  CSW informed her of hospital drug screen policy and mandated reporting for positive screens.  MOB denies substance use and states she has no concerns.  Baby's UDS is negative.  CSW will monitor CDS results.  CSW Plan/Description:  Information/Referral to [employer] , No Further Intervention Required/No Barriers to Discharge, Patient/Family Education     Alphonzo Cruise, [place] 05/03/2016, 4:10 PM

## 2016-05-03 NOTE — Progress Notes (Signed)
Post Partum Day #1 Subjective: no complaints, up ad lib, voiding and tolerating PO  Objective: Blood pressure (!) 145/100, pulse 70, temperature 97.7 F (36.5 C), temperature source Other (Comment), resp. rate 18, height 5\' 1"  (1.549 m), weight 68.5 kg (151 lb), last menstrual period 08/05/2015, unknown if currently breastfeeding.  Physical Exam:  General: alert Lochia: appropriate Uterine Fundus: firm and NT at U-1 DVT Evaluation: No evidence of DVT seen on physical exam.   Recent Labs  05/02/16 1735 05/03/16 0552  HGB 8.1* 7.9*  HCT 24.5* 23.9*    Assessment/Plan: Plan for discharge tomorrow   LOS: 1 day   Nazia Rhines C Erian Lariviere 05/03/2016, 7:46 AM

## 2016-05-04 MED ORDER — IBUPROFEN 600 MG PO TABS: 600.0000 mg | ORAL_TABLET | Freq: Four times a day (QID) | ORAL | 0 refills | Status: DC

## 2016-05-04 NOTE — Discharge Instructions (Signed)
Contraception Choices Contraception, also called birth control, means things to use or ways to try not to get pregnant. Hormonal birth control  This kind of birth control uses hormones. Here are some types of hormonal birth control:  A tube that is put under skin of the arm (implant). The tube can stay in for as long as 3 years.  Shots to get every 3 months (injections).  Pills to take every day (birth control pills).  A patch to change 1 time each week for 3 weeks (birth control patch). After that, the patch is taken off for 1 week.  A ring to put in the vagina. The ring is left in for 3 weeks. Then it is taken out of the vagina for 1 week. Then a new ring is put in.  Pills to take after unprotected sex (emergency birth control pills). Barrier birth control  Here are some types of barrier birth control:  A thin covering that is put on the penis before sex (female condom). The covering is thrown away after sex.  A soft, loose covering that is put in the vagina before sex (female condom). The covering is thrown away after sex.  A rubber bowl that sits over the cervix (diaphragm). The bowl must be made for you. The bowl is put into the vagina before sex. The bowl is left in for 6-8 hours after sex. It is taken out within 24 hours.  A small, soft cup that fits over the cervix (cervical cap). The cup must be made for you. The cup can be left in for 6-8 hours after sex. It is taken out within 48 hours.  A sponge that is put into the vagina before sex. It must be left in for at least 6 hours after sex. It must be taken out within 30 hours. Then it is thrown away.  A chemical that kills or stops sperm from getting into the uterus (spermicide). It may be a pill, cream, jelly, or foam to put in the vagina. The chemical should be used at least 10-15 minutes before sex. IUD (intrauterine) birth control An IUD is a small, T-shaped piece of plastic. It is put inside the uterus. There are two  kinds:  Hormone IUD. This kind can stay in for 3-5 years.  Copper IUD. This kind can stay in for 10 years. Permanent birth control Here are some types of permanent birth control:  Surgery to block the fallopian tubes.  Having an insert put into each fallopian tube.  Surgery to tie off the tubes that carry sperm (vasectomy). Natural planning birth control Here are some types of natural planning birth control:  Not having sex on the days the woman could get pregnant.  Using a calendar:  To keep track of the length of each period.  To find out what days pregnancy can happen.  To plan to not have sex on days when pregnancy can happen.  Watching for symptoms of ovulation and not having sex during ovulation. One way the woman can check for ovulation is to check her temperature.  Waiting to have sex until after ovulation. Summary  Contraception, also called birth control, means things to use or ways to try not to get pregnant.  Hormonal methods of birth control include implants, injections, pills, patches, vaginal rings, and emergency birth control pills.  Barrier methods of birth control can include female condoms, female condoms, diaphragms, cervical caps, sponges, and spermicides.  There are two types of IUD (intrauterine device)  birth control. An IUD can be put in a woman's uterus to prevent pregnancy for 3-5 years.  Permanent sterilization can be done through a procedure for males, females, or both.  Natural planning methods involve not having sex on the days when the woman could get pregnant. This information is not intended to replace advice given to you by your health care provider. Make sure you discuss any questions you have with your health care provider. Document Released: 12/13/2008 Document Revised: 02/26/2016 Document Reviewed: 02/26/2016 Elsevier Interactive Patient Education  2017 [place] Instructions for Mom  ACTIVITY  Gradually return to your  regular activities.  Let yourself rest. Nap while your baby sleeps.  Avoid lifting anything that is heavier than 10 lb (4.5 kg) until your health care provider says it is okay.  Avoid activities that take a lot of effort and energy (are strenuous) until approved by your health care provider. Walking at a slow-to-moderate pace is usually safe.  If you had a cesarean delivery:  Do not vacuum, climb stairs, or drive a car for 4-6 weeks.  Have someone help you at home until you feel like you can do your usual activities yourself.  Do exercises as told by your health care provider, if this applies. VAGINAL BLEEDING You may continue to bleed for 4-6 weeks after delivery. Over time, the amount of blood usually decreases and the color of the blood usually gets lighter. However, the flow of bright red blood may increase if you have been too active. If you need to use more than one pad in an hour because your pad gets soaked, or if you pass a large clot:  Lie down.  Raise your feet.  Place a cold compress on your lower abdomen.  Rest.  Call your health care provider. If you are breastfeeding, your period should return anytime between 8 weeks after delivery and the time that you stop breastfeeding. If you are not breastfeeding, your period should return 6-8 weeks after delivery. PERINEAL CARE The perineal area, or perineum, is the part of your body between your thighs. After delivery, this area needs special care. Follow these instructions as told by your health care provider.  Take warm tub baths for 15-20 minutes.  Use medicated pads and pain-relieving sprays and creams as told.  Do not use tampons or douches until vaginal bleeding has stopped.  Each time you go to the bathroom:  Use a peri bottle.  Change your pad.  Use towelettes in place of toilet paper until your stitches have healed.  Do Kegel exercises every day. Kegel exercises help to maintain the muscles that support the  vagina, bladder, and bowels. You can do these exercises while you are standing, sitting, or lying down. To do Kegel exercises:  Tighten the muscles of your abdomen and the muscles that surround your birth canal.  Hold for a few seconds.  Relax.  Repeat until you have done this 5 times in a row.  To prevent hemorrhoids from developing or getting worse:  Drink enough fluid to keep your urine clear or pale yellow.  Avoid straining when having a bowel movement.  Take over-the-counter medicines and stool softeners as told by your health care provider. BREAST CARE  Wear a tight-fitting bra.  Avoid taking over-the-counter pain medicine for breast discomfort.  Apply ice to the breasts to help with discomfort as needed:  Put ice in a plastic bag.  Place a towel between your skin and the bag.  Leave the ice on for 20 minutes or as told by your health care provider. NUTRITION  Eat a well-balanced diet.  Do not try to lose weight quickly by cutting back on calories.  Take your prenatal vitamins until your postpartum checkup or until your health care provider tells you to stop. POSTPARTUM DEPRESSION You may find yourself crying for no apparent reason and unable to cope with all of the changes that come with having a newborn. This mood is called postpartum depression. Postpartum depression happens because your hormone levels change after delivery. If you have postpartum depression, get support from your partner, friends, and family. If the depression does not go away on its own after several weeks, contact your health care provider. BREAST SELF-EXAM Do a breast self-exam each month, at the same time of the month. If you are breastfeeding, check your breasts just after a feeding, when your breasts are less full. If you are breastfeeding and your period has started, check your breasts on day 5, 6, or 7 of your period. Report any lumps, bumps, or discharge to your health care provider. Know  that breasts are normally lumpy if you are breastfeeding. This is temporary, and it is not a health risk. INTIMACY AND SEXUALITY Avoid sexual activity for at least 3-4 weeks after delivery or until the brownish-red vaginal flow is completely gone. If you want to avoid pregnancy, use some form of birth control. You can get pregnant after delivery, even if you have not had your period. SEEK MEDICAL CARE IF:  You feel unable to cope with the changes that a child brings to your life, and these feelings do not go away after several weeks.  You notice a lump, a bump, or discharge on your breast. SEEK IMMEDIATE MEDICAL CARE IF:  Blood soaks your pad in 1 hour or less.  You have:  Severe pain or cramping in your lower abdomen.  A bad-smelling vaginal discharge.  A fever that is not controlled by medicine.  A fever, and an area of your breast is red and sore.  Pain or redness in your calf.  Sudden, severe chest pain.  Shortness of breath.  Painful or bloody urination.  Problems with your vision.  You vomit for 12 hours or longer.  You develop a severe headache.  You have serious thoughts about hurting yourself, your child, or anyone else. This information is not intended to replace advice given to you by your health care provider. Make sure you discuss any questions you have with your health care provider. Document Released: 02/13/2000 Document Revised: 07/24/2015 Document Reviewed: 08/19/2014 Elsevier Interactive Patient Education  2017 [employer] Iron-Rich Diet Iron is a mineral that helps your body to produce hemoglobin. Hemoglobin is a protein in your red blood cells that carries oxygen to your body's tissues. Eating too little iron may cause you to feel weak and tired, and it can increase your risk for infection. Eating enough iron is necessary for your body's metabolism, muscle function, and nervous system. Iron is naturally found in many foods. It can also be added to  foods or fortified in foods. There are two types of dietary iron:  Heme iron. Heme iron is absorbed by the body more easily than nonheme iron. Heme iron is found in meat, poultry, and fish.  Nonheme iron. Nonheme iron is found in dietary supplements, iron-fortified grains, beans, and vegetables. You may need to follow an iron-rich diet if:  You have been diagnosed with iron deficiency or  iron-deficiency anemia.  You have a condition that prevents you from absorbing dietary iron, such as:  Infection in your intestines.  Celiac disease. This involves long-lasting (chronic) inflammation of your intestines.  You do not eat enough iron.  You eat a diet that is high in foods that impair iron absorption.  You have lost a lot of blood.  You have heavy bleeding during your menstrual cycle.  You are pregnant. What is my plan? Your health care provider may help you to determine how much iron you need per day based on your condition. Generally, when a person consumes sufficient amounts of iron in the diet, the following iron needs are met:  Men.  62-67 years old: 11 mg per day.  48-19 years old: 8 mg per day.  Women.  76-6 years old: 15 mg per day.  57-57 years old: 18 mg per day.  Over 61 years old: 8 mg per day.  Pregnant women: 27 mg per day.  Breastfeeding women: 9 mg per day. What do I need to know about an iron-rich diet?  Eat fresh fruits and vegetables that are high in vitamin C along with foods that are high in iron. This will help increase the amount of iron that your body absorbs from food, especially with foods containing nonheme iron. Foods that are high in vitamin C include oranges, peppers, tomatoes, and mango.  Take iron supplements only as directed by your health care provider. Overdose of iron can be life-threatening. If you were prescribed iron supplements, take them with orange juice or a vitamin C supplement.  Cook foods in pots and pans that are made from  iron.  Eat nonheme iron-containing foods alongside foods that are high in heme iron. This helps to improve your iron absorption.  Certain foods and drinks contain compounds that impair iron absorption. Avoid eating these foods in the same meal as iron-rich foods or with iron supplements. These include:  Coffee, black tea, and red wine.  Milk, dairy products, and foods that are high in calcium.  Beans, soybeans, and peas.  Whole grains.  When eating foods that contain both nonheme iron and compounds that impair iron absorption, follow these tips to absorb iron better.  Soak beans overnight before cooking.  Soak whole grains overnight and drain them before using.  Ferment flours before baking, such as using yeast in bread dough. What foods can I eat? Grains  Iron-fortified breakfast cereal. Iron-fortified whole-wheat bread. Enriched rice. Sprouted grains. Vegetables  Spinach. Potatoes with skin. Green peas. Broccoli. Red and green bell peppers. Fermented vegetables. Fruits  Prunes. Raisins. Oranges. Strawberries. Mango. Grapefruit. Meats and Other Protein Sources  Beef liver. Oysters. Beef. Shrimp. [place]. Chicken. [place]. Sardines. Chickpeas. Nuts. Tofu. Beverages  Tomato juice. Fresh orange juice. Prune juice. Hibiscus tea. Fortified instant breakfast shakes. Condiments  Tahini. Fermented soy sauce. Sweets and Desserts  Black-strap molasses. Other  Wheat germ. The items listed above may not be a complete list of recommended foods or beverages. Contact your dietitian for more options.  What foods are not recommended? Grains  Whole grains. Bran cereal. Bran flour. Oats. Vegetables  Artichokes. Brussels sprouts. Kale. Fruits  Blueberries. Raspberries. Strawberries. Figs. Meats and Other Protein Sources  Soybeans. Products made from soy protein. Dairy  Milk. Cream. Cheese. Yogurt. Cottage cheese. Beverages  Coffee. Black tea. Red wine. Sweets and Desserts  Cocoa.  Chocolate. Ice cream. Other  Basil. Oregano. Parsley. The items listed above may not be a complete list of foods  and beverages to avoid. Contact your dietitian for more information.  This information is not intended to replace advice given to you by your health care provider. Make sure you discuss any questions you have with your health care provider. Document Released: 09/29/2004 Document Revised: 09/05/2015 Document Reviewed: 09/12/2013 Elsevier Interactive Patient Education  2017 [employer].

## 2016-05-04 NOTE — Discharge Summary (Signed)
Obstetric Discharge Summary Reason for Admission: [redacted] weeks EGA, SOL Prenatal Procedures: ultrasound Intrapartum Procedures: spontaneous vaginal delivery Postpartum Procedures: none Complications-Operative and Postpartum: none Hemoglobin  Date Value Ref Range Status  05/03/2016 7.9 (L) 12.0 - 15.0 g/dL Final   HGB  Date Value Ref Range Status  01/02/2014 10.3 (L) 12.0 - 16.0 g/dL Final   HCT  Date Value Ref Range Status  05/03/2016 23.9 (L) 36.0 - 46.0 % Final  01/04/2014 22.9 (L) 35.0 - 47.0 % Final    Physical Exam:  General: alert Lochia: appropriate Uterine Fundus: firm Incision: healing well DVT Evaluation: No evidence of DVT seen on physical exam.  Discharge Diagnoses: [redacted] weeks EGA, delivered  Discharge Information: Date: 05/04/2016 Activity: pelvic rest Diet: routine Medications: PNV, Ibuprofen and Iron Condition: stable Instructions: refer to practice specific booklet Discharge to: home  She declines contraception today, says that she wants a BTL but doesn't have MCD and declines having someone here help her get it. Please note that she has 3 kids under 741 years old. Follow-up Information    Dorathy KinsmanVirginia Smith, [place]. Schedule an appointment as soon as possible for a visit in 2 week(s).   Specialty:  Obstetrics and Gynecology Why:  BP check and contraception Also will need a 6 week pp visit Contact information: [address] [place] [id number] [phone]           Newborn Data: Live born female  Birth Weight: 6 lb 6.3 oz (2900 g) APGAR: 9, 9  Home with mother.  Allie BossierMyra C Lekisha Mcghee 05/04/2016, 7:41 AM

## 2016-05-05 ENCOUNTER — Encounter: Payer: Self-pay | Admitting: Obstetrics and Gynecology

## 2016-05-06 ENCOUNTER — Encounter: Payer: Self-pay | Admitting: Obstetrics and Gynecology

## 2016-05-08 ENCOUNTER — Inpatient Hospital Stay (HOSPITAL_COMMUNITY)
Admission: AD | Admit: 2016-05-08 | Discharge: 2016-05-08 | Disposition: A | Discharge status: Home / Self Care | Payer: Medicaid Other | Source: Ambulatory Visit | Attending: Obstetrics & Gynecology | Admitting: Obstetrics & Gynecology

## 2016-05-08 ENCOUNTER — Encounter (HOSPITAL_COMMUNITY): Payer: Self-pay

## 2016-05-08 DIAGNOSIS — O1495 Unspecified pre-eclampsia, complicating the puerperium: Secondary | ICD-10-CM | POA: Insufficient documentation

## 2016-05-08 LAB — URINALYSIS, ROUTINE W REFLEX MICROSCOPIC
Bilirubin Urine: NEGATIVE
Glucose, UA: NEGATIVE mg/dL
Ketones, ur: NEGATIVE mg/dL
Nitrite: NEGATIVE
PROTEIN: 30 mg/dL — AB
SPECIFIC GRAVITY, URINE: 1.018 (ref 1.005–1.030)
Trans Epithel, UA: 1
pH: 8 (ref 5.0–8.0)

## 2016-05-08 LAB — CBC
HEMATOCRIT: 25.2 % — AB (ref 36.0–46.0)
HEMOGLOBIN: 8.1 g/dL — AB (ref 12.0–15.0)
MCH: 25.7 pg — ABNORMAL LOW (ref 26.0–34.0)
MCHC: 32.1 g/dL (ref 30.0–36.0)
MCV: 80 fL (ref 78.0–100.0)
Platelets: 473 10*3/uL — ABNORMAL HIGH (ref 150–400)
RBC: 3.15 MIL/uL — ABNORMAL LOW (ref 3.87–5.11)
RDW: 15.2 % (ref 11.5–15.5)
WBC: 12.2 10*3/uL — ABNORMAL HIGH (ref 4.0–10.5)

## 2016-05-08 LAB — COMPREHENSIVE METABOLIC PANEL
ALBUMIN: 3 g/dL — AB (ref 3.5–5.0)
ALK PHOS: 157 U/L — AB (ref 38–126)
ALT: 15 U/L (ref 14–54)
AST: 25 U/L (ref 15–41)
Anion gap: 9 (ref 5–15)
BILIRUBIN TOTAL: 0.1 mg/dL — AB (ref 0.3–1.2)
BUN: 13 mg/dL (ref 6–20)
CALCIUM: 8.2 mg/dL — AB (ref 8.9–10.3)
CO2: 24 mmol/L (ref 22–32)
CREATININE: 0.58 mg/dL (ref 0.44–1.00)
Chloride: 103 mmol/L (ref 101–111)
GFR calc Af Amer: >60 mL/min (ref >60)
GLUCOSE: 90 mg/dL (ref 65–99)
POTASSIUM: 3.4 mmol/L — AB (ref 3.5–5.1)
Sodium: 136 mmol/L (ref 135–145)
Total Protein: 7.2 g/dL (ref 6.5–8.1)

## 2016-05-08 LAB — PROTEIN / CREATININE RATIO, URINE
CREATININE, URINE: 193 mg/dL
Protein Creatinine Ratio: 0.13 mg/mg{Cre} (ref 0.00–0.15)
Total Protein, Urine: 26 mg/dL

## 2016-05-08 MED ORDER — AMLODIPINE BESYLATE 10 MG PO TABS: 10.0000 mg | ORAL_TABLET | Freq: Every day | ORAL | 1 refills | Status: DC

## 2016-05-08 MED ORDER — LACTATED RINGERS IV SOLN
INTRAVENOUS | Status: DC
  Administered 2016-05-08: 11:00:00 via INTRAVENOUS

## 2016-05-08 MED ORDER — LABETALOL HCL 5 MG/ML IV SOLN
20.0000 mg | Freq: Once | INTRAVENOUS | Status: AC
  Administered 2016-05-08: 20 mg via INTRAVENOUS
  Filled 2016-05-08: qty 4

## 2016-05-08 MED ORDER — AMLODIPINE BESYLATE 10 MG PO TABS
10.0000 mg | ORAL_TABLET | Freq: Every day | ORAL | Status: DC
  Administered 2016-05-08: 10 mg via ORAL
  Filled 2016-05-08: qty 1

## 2016-05-08 MED ORDER — LABETALOL HCL 100 MG PO TABS: 200.0000 mg | ORAL_TABLET | Freq: Once | ORAL | Status: DC

## 2016-05-08 NOTE — Discharge Instructions (Signed)
Postpartum Hypertension  Postpartum hypertension is high blood pressure after pregnancy that remains higher than normal for more than two days after delivery. You may not realize that you have postpartum hypertension if your blood pressure is not being checked regularly. In some cases, postpartum hypertension will go away on its own, usually within a week of delivery. However, for some women, medical treatment is required to prevent serious complications, such as seizures or stroke.  The following things can affect your blood pressure:  · The type of delivery you had.  · Having received IV fluids or other medicines during or after delivery.    What are the causes?  Postpartum hypertension may be caused by any of the following or by a combination of any of the following:  · Hypertension that existed before pregnancy (chronic hypertension).  · Gestational hypertension.  · Preeclampsia or eclampsia.  · Receiving a lot of fluid through an IV during or after delivery.  · Medicines.  · HELLP syndrome.  · Hyperthyroidism.  · Stroke.  · Other rare neurological or blood disorders.    In some cases, the cause may not be known.  What increases the risk?  Postpartum hypertension can be related to one or more risk factors, such as:  · Chronic hypertension. In some cases, this may not have been diagnosed before pregnancy.  · Obesity.  · Type 2 diabetes.  · Kidney disease.  · Family history of preeclampsia.  · Other medical conditions that cause hormonal imbalances.    What are the signs or symptoms?  As with all types of hypertension, postpartum hypertension may not have any symptoms. Depending on how high your blood pressure is, you may experience:  · Headaches. These may be mild, moderate, or severe. They may also be steady, constant, or sudden in onset (thunderclap headache).  · Visual changes.  · Dizziness.  · Shortness of breath.  · Swelling of your hands, feet, lower legs, or face. In some cases, you may have swelling in  more than one of these locations.  · Heart palpitations or a racing heartbeat.  · Difficulty breathing while lying down.  · Decreased urination.    Other rare signs and symptoms may include:  · Sweating more than usual. This lasts longer than a few days after delivery.  · Chest pain.  · Sudden dizziness when you get up from sitting or lying down.  · Seizures.  · Nausea or vomiting.  · Abdominal pain.    How is this diagnosed?  The diagnosis of postpartum hypertension is made through a combination of physical examination findings and testing of your blood and urine. You may also have additional tests, such as a CT scan or an MRI, to check for other complications of postpartum hypertension.  How is this treated?  When blood pressure is high enough to require treatment, your options may include:  · Medicines to reduce blood pressure (antihypertensives). Tell your health care provider if you are breastfeeding or if you plan to breastfeed. There are many antihypertensive medicines that are safe to take while breastfeeding.  · Stopping medicines that may be causing hypertension.  · Treating medical conditions that are causing hypertension.  · Treating the complications of hypertension, such as seizures, stroke, or kidney problems.    Your health care provider will also continue to monitor your blood pressure closely and repeatedly until it is within a safe range for you.  Follow these instructions at home:  · Take medicines only   directed by your health care provider. This is important. Contact a health care provider if:  Your symptoms  get worse.  You have new symptoms, such as:  Headache.  Dizziness.  Visual changes. Get help right away if:  You develop a severe or sudden headache.  You have seizures.  You develop numbness or weakness on one side of your body.  You have difficulty thinking, speaking, or swallowing.  You develop severe abdominal pain.  You develop difficulty breathing, chest pain, a racing heartbeat, or heart palpitations. These symptoms may represent a serious problem that is an emergency. Do not wait to see if the symptoms will go away. Get medical help right away. Call your local emergency services (911 in the [place] Do not drive yourself to the hospital. This information is not intended to replace advice given to you by your health care provider. Make sure you discuss any questions you have with your health care provider. Document Released: 10/19/2013 Document Revised: 07/21/2015 Document Reviewed: 08/30/2013 Elsevier Interactive Patient Education  2017 [employer] Inc[employer]   Preeclampsia and Eclampsia Preeclampsia is a serious condition that develops only during pregnancy. It is also called toxemia of pregnancy. This condition causes high blood pressure along with other symptoms, such as swelling and headaches. These symptoms may develop as the condition gets worse. Preeclampsia may occur at 20 weeks of pregnancy or later. Diagnosing and treating preeclampsia early is very important. If not treated early, it can cause serious problems for you and your baby. One problem it can lead to is eclampsia, which is a condition that causes muscle jerking or shaking (convulsions or seizures) in the mother. Delivering your baby is the best treatment for preeclampsia or eclampsia. Preeclampsia and eclampsia symptoms usually go away after your baby is born. What are the causes? The cause of preeclampsia is not known. What increases the risk? The following risk factors make you more likely to develop  preeclampsia:  Being pregnant for the first time.  Having had preeclampsia during a past pregnancy.  Having a family history of preeclampsia.  Having high blood pressure.  Being pregnant with twins or triplets.  Being 61 or older.  Being African-American.  Having kidney disease or diabetes.  Having medical conditions such as lupus or blood diseases.  Being very overweight (obese). What are the signs or symptoms? The earliest signs of preeclampsia are:  High blood pressure.  Increased protein in your urine. Your health care provider will check for this at every visit before you give birth (prenatal visit). Other symptoms that may develop as the condition gets worse include:  Severe headaches.  Sudden weight gain.  Swelling of the hands, face, legs, and feet.  Nausea and vomiting.  Vision problems, such as blurred or double vision.  Numbness in the face, arms, legs, and feet.  Urinating less than usual.  Dizziness.  Slurred speech.  Abdominal pain, especially upper abdominal pain.  Convulsions or seizures. Symptoms generally go away after giving birth. How is this diagnosed? There are no screening tests for preeclampsia. Your health care provider will ask you about symptoms and check for signs of preeclampsia during your prenatal visits. You may also have tests that include:  Urine tests.  Blood tests.  Checking your blood pressure.  Monitoring your babys heart rate.  Ultrasound. How is this treated? You and your health care provider will determine the treatment approach that is best for you. Treatment may include:  Having more frequent prenatal exams to check for signs  of preeclampsia, if you have an increased risk for preeclampsia.  Bed rest.  Reducing how much salt (sodium) you eat.  Medicine to lower your blood pressure.  Staying in the hospital, if your condition is severe. There, treatment will focus on controlling your blood pressure and  the amount of fluids in your body (fluid retention).  You may need to take medicine (magnesium sulfate) to prevent seizures. This medicine may be given as an injection or through an IV tube.  Delivering your baby early, if your condition gets worse. You may have your labor started with medicine (induced), or you may have a cesarean delivery. Follow these instructions at home: Eating and drinking    Drink enough fluid to keep your urine clear or pale yellow.  Eat a healthy diet that is low in sodium. Do not add salt to your food. Check nutrition labels to see how much sodium a food or beverage contains.  Avoid caffeine. Lifestyle   Do not use any products that contain nicotine or tobacco, such as cigarettes and e-cigarettes. If you need help quitting, ask your health care provider.  Do not use alcohol or drugs.  Avoid stress as much as possible. Rest and get plenty of sleep. General instructions   Take over-the-counter and prescription medicines only as told by your health care provider.  When lying down, lie on your side. This keeps pressure off of your baby.  When sitting or lying down, raise (elevate) your feet. Try putting some pillows underneath your lower legs.  Exercise regularly. Ask your health care provider what kinds of exercise are best for you.  Keep all follow-up and prenatal visits as told by your health care provider. This is important. How is this prevented? To prevent preeclampsia or eclampsia from developing during another pregnancy:  Get proper medical care during pregnancy. Your health care provider may be able to prevent preeclampsia or diagnose and treat it early.  Your health care provider may have you take a low-dose aspirin or a calcium supplement during your next pregnancy.  You may have tests of your blood pressure and kidney function after giving birth.  Maintain a healthy weight. Ask your health care provider for help managing weight gain during  pregnancy.  Work with your health care provider to manage any long-term (chronic) health conditions you have, such as diabetes or kidney problems. Contact a health care provider if:  You gain more weight than expected.  You have headaches.  You have nausea or vomiting.  You have abdominal pain.  You feel dizzy or light-headed. Get help right away if:  You develop sudden or severe swelling anywhere in your body. This usually happens in the legs.  You gain 5 lbs (2.3 kg) or more during one week.  You have severe:  Abdominal pain.  Headaches.  Dizziness.  Vision problems.  Confusion.  Nausea or vomiting.  You have a seizure.  You have trouble moving any part of your body.  You develop numbness in any part of your body.  You have trouble speaking.  You have any abnormal bleeding.  You pass out. This information is not intended to replace advice given to you by your health care provider. Make sure you discuss any questions you have with your health care provider. Document Released: 02/13/2000 Document Revised: 10/14/2015 Document Reviewed: 09/22/2015 Elsevier Interactive Patient Education  2017 [employer]

## 2016-05-08 NOTE — MAU Note (Signed)
Patient on cell phone continuously advised patient due to her BP being extremely high she should not be on her phone and relax as much as possible as we are attempting to get her BP to lower, she verbalized an understanding and discontinued her conversation.

## 2016-05-08 NOTE — MAU Provider Note (Signed)
History     CSN: [id number]  Arrival date and time: 05/08/16 2458   First Provider Initiated Contact with Patient 05/08/16 1037      Chief Complaint  Patient presents with  . Vaginal Bleeding   HPI   [name] is a 21 y.o. female G3P2103 postpartum, status post vaginal delivery on 3/4 here with concerns about a clot she passed yesterday. She is concerned it was part of her placenta. She denies abdominal pain at this time. Her bleeding is "normal".   Upon arrival, patient's blood pressure is severely elevated. Patient denies changes in her vision, HA or abdominal pain. Says she had preeclampsia with first two pregnancies and her BP was elevated in the hospital with this last pregnancy. She had limited prenatal care. She is not on BP medication at this time.   OB History    Gravida Para Term Preterm AB Living   '3 3 2 1   3   ' SAB TAB Ectopic Multiple Live Births         0 3      Past Medical History:  Diagnosis Date  . Group beta Strep positive   . Pregnancy induced hypertension 2015    Past Surgical History:  Procedure Laterality Date  . NO PAST SURGERIES      Family History  Problem Relation Age of Onset  . Hypertension Paternal Grandmother     Social History  Substance Use Topics  . Smoking status: Never Smoker  . Smokeless tobacco: Never Used  . Alcohol use No    Allergies:  Allergies  Allergen Reactions  . 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   Reaction:  All over body swelling     No prescriptions prior to admission.   Results for orders placed or performed during the hospital encounter of 05/08/16 (from the past 48 hour(s))  Urinalysis, Routine w reflex microscopic     Status: Abnormal   Collection Time: 05/08/16  9:50 AM  Result Value Ref Range   Color, Urine YELLOW YELLOW   APPearance CLEAR CLEAR   Specific Gravity, Urine 1.018 1.005 - 1.030   pH 8.0 5.0 - 8.0   Glucose, UA NEGATIVE NEGATIVE  mg/dL   Hgb urine dipstick MODERATE (A) NEGATIVE   Bilirubin Urine NEGATIVE NEGATIVE   Ketones, ur NEGATIVE NEGATIVE mg/dL   Protein, ur 30 (A) NEGATIVE mg/dL   Nitrite NEGATIVE NEGATIVE   Leukocytes, UA SMALL (A) NEGATIVE   RBC / HPF TOO NUMEROUS TO COUNT 0 - 5 RBC/hpf   WBC, UA 6-30 0 - 5 WBC/hpf   Bacteria, UA RARE (A) NONE SEEN   Squamous Epithelial / LPF 0-5 (A) NONE SEEN   Trans Epithel, UA 1    Mucous PRESENT   Protein / creatinine ratio, urine     Status: None   Collection Time: 05/08/16  9:50 AM  Result Value Ref Range   Creatinine, Urine 193.00 mg/dL   Total Protein, Urine 26 mg/dL    Comment: NO NORMAL RANGE ESTABLISHED FOR THIS TEST   Protein Creatinine Ratio 0.13 0.00 - 0.15 mg/mg[Cre]  CBC     Status: Abnormal   Collection Time: 05/08/16 10:19 AM  Result Value Ref Range   WBC 12.2 (H) 4.0 - 10.5 K/uL   RBC 3.15 (L) 3.87 - 5.11 MIL/uL   Hemoglobin 8.1 (L) 12.0 - 15.0 g/dL   HCT 25.2 (L) 36.0 - 46.0 %   MCV 80.0  78.0 - 100.0 fL   MCH 25.7 (L) 26.0 - 34.0 pg   MCHC 32.1 30.0 - 36.0 g/dL   RDW 15.2 11.5 - 15.5 %   Platelets 473 (H) 150 - 400 K/uL  Comprehensive metabolic panel     Status: Abnormal   Collection Time: 05/08/16 10:19 AM  Result Value Ref Range   Sodium 136 135 - 145 mmol/L   Potassium 3.4 (L) 3.5 - 5.1 mmol/L   Chloride 103 101 - 111 mmol/L   CO2 24 22 - 32 mmol/L   Glucose, Bld 90 65 - 99 mg/dL   BUN 13 6 - 20 mg/dL   Creatinine, Ser 0.58 0.44 - 1.00 mg/dL   Calcium 8.2 (L) 8.9 - 10.3 mg/dL   Total Protein 7.2 6.5 - 8.1 g/dL   Albumin 3.0 (L) 3.5 - 5.0 g/dL   AST 25 15 - 41 U/L   ALT 15 14 - 54 U/L   Alkaline Phosphatase 157 (H) 38 - 126 U/L   Total Bilirubin 0.1 (L) 0.3 - 1.2 mg/dL   GFR calc non Af Amer >60 >60 mL/min   GFR calc Af Amer >60 >60 mL/min    Comment: (NOTE) The eGFR has been calculated using the CKD EPI equation. This calculation has not been validated in all clinical situations. eGFR's persistently <60 mL/min signify  possible Chronic Kidney Disease.    Anion gap 9 5 - 15    Review of Systems  Eyes: Negative for photophobia.  Respiratory: Negative for shortness of breath.   Neurological: Negative for headaches.   Physical Exam   Blood pressure (!) 150/121, pulse 103, temperature 99.4 F (37.4 C), resp. rate 18, unknown if currently breastfeeding.   Patient Vitals for the past 24 hrs:  BP Temp Pulse Resp  05/08/16 1238 128/82 - 97 18  05/08/16 1200 133/74 - 97 -  05/08/16 1148 132/78 - 88 -  05/08/16 1117 146/97 - 89 -  05/08/16 1106 157/98 - 103 -  05/08/16 1033 (!) 150/121 - - -  05/08/16 1007 (!) 162/118 - - -  05/08/16 1003 (!) 147/106 99.4 F (37.4 C) 103 18    Physical Exam  Constitutional: She is oriented to person, place, and time. She appears well-developed and well-nourished. No distress.  HENT:  Head: Normocephalic.  Eyes: Pupils are equal, round, and reactive to light.  Respiratory: Effort normal.  GI: Soft. She exhibits no distension. There is no tenderness. There is no rebound and no guarding.  Musculoskeletal: Normal range of motion. She exhibits no edema.  Neurological: She is alert and oriented to person, place, and time. She has normal reflexes.  Negative clonus   Skin: Skin is warm. She is not diaphoretic.  Psychiatric: Her behavior is normal.   MAU Course  Procedures  None  MDM  PIH labs Norvasc 10 mg PO Labetalol 20 mg IV Patient responded well to Bp medication. Current BP 128/82. [place] labs WNL> discussed with Dr. Roselie Awkward. Ok to dc with close follow up for BP check.   Assessment and Plan   A:  1. Preeclampsia in postpartum period     P:  Discharge home in stable condition Rx: Norvasc 10 mg daily.  Strict return precautions Patient to go to the [facility] on Monday 3/12 @ 1100 for BP check.  Preeclampsia precautions.   Lezlie Lye, NP 05/08/2016 1:51 PM

## 2016-05-08 NOTE — MAU Note (Signed)
Patient presents s/p vaginal birth on 05/02/16 with passing what she refers to placenta yesterday, patient brought in contents in clear baggy, stating has been bleeding and messing up her clothes.

## 2016-05-10 ENCOUNTER — Ambulatory Visit: Payer: Self-pay

## 2016-06-07 ENCOUNTER — Ambulatory Visit: Payer: Self-pay | Admitting: Advanced Practice Midwife

## 2018-07-29 ENCOUNTER — Inpatient Hospital Stay (HOSPITAL_COMMUNITY): Payer: Medicaid Other

## 2018-07-29 ENCOUNTER — Encounter (HOSPITAL_COMMUNITY): Payer: Self-pay

## 2018-07-29 ENCOUNTER — Inpatient Hospital Stay (HOSPITAL_COMMUNITY)
Admission: AD | Admit: 2018-07-29 | Discharge: 2018-07-29 | Disposition: A | Discharge status: Home / Self Care | Payer: Medicaid Other | Attending: Obstetrics and Gynecology | Admitting: Obstetrics and Gynecology

## 2018-07-29 ENCOUNTER — Other Ambulatory Visit: Payer: Self-pay

## 2018-07-29 DIAGNOSIS — O3680X0 Pregnancy with inconclusive fetal viability, not applicable or unspecified: Secondary | ICD-10-CM | POA: Insufficient documentation

## 2018-07-29 DIAGNOSIS — Z79899 Other long term (current) drug therapy: Secondary | ICD-10-CM | POA: Insufficient documentation

## 2018-07-29 DIAGNOSIS — R109 Unspecified abdominal pain: Secondary | ICD-10-CM | POA: Diagnosis not present

## 2018-07-29 DIAGNOSIS — O209 Hemorrhage in early pregnancy, unspecified: Secondary | ICD-10-CM | POA: Insufficient documentation

## 2018-07-29 DIAGNOSIS — O9989 Other specified diseases and conditions complicating pregnancy, childbirth and the puerperium: Secondary | ICD-10-CM | POA: Insufficient documentation

## 2018-07-29 DIAGNOSIS — Z3A01 Less than 8 weeks gestation of pregnancy: Secondary | ICD-10-CM | POA: Insufficient documentation

## 2018-07-29 DIAGNOSIS — O26891 Other specified pregnancy related conditions, first trimester: Secondary | ICD-10-CM | POA: Diagnosis not present

## 2018-07-29 DIAGNOSIS — Z91018 Allergy to other foods: Secondary | ICD-10-CM | POA: Insufficient documentation

## 2018-07-29 LAB — WET PREP, GENITAL
Clue Cells Wet Prep HPF POC: NONE SEEN
Sperm: NONE SEEN
Trich, Wet Prep: NONE SEEN
Yeast Wet Prep HPF POC: NONE SEEN

## 2018-07-29 LAB — HCG, QUANTITATIVE, PREGNANCY: hCG, Beta Chain, Quant, S: 4551 m[IU]/mL — ABNORMAL HIGH (ref <5)

## 2018-07-29 LAB — URINALYSIS, ROUTINE W REFLEX MICROSCOPIC
Bacteria, UA: NONE SEEN
Bilirubin Urine: NEGATIVE
Glucose, UA: NEGATIVE mg/dL
Ketones, ur: NEGATIVE mg/dL
Leukocytes,Ua: NEGATIVE
Nitrite: NEGATIVE
Protein, ur: 30 mg/dL — AB
RBC / HPF: >50 RBC/hpf — ABNORMAL HIGH (ref 0–5)
Specific Gravity, Urine: 1.026 (ref 1.005–1.030)
pH: 6 (ref 5.0–8.0)

## 2018-07-29 LAB — POCT PREGNANCY, URINE: Preg Test, Ur: POSITIVE — AB

## 2018-07-29 LAB — CBC
HCT: 33.3 % — ABNORMAL LOW (ref 36.0–46.0)
Hemoglobin: 10.9 g/dL — ABNORMAL LOW (ref 12.0–15.0)
MCH: 27.2 pg (ref 26.0–34.0)
MCHC: 32.7 g/dL (ref 30.0–36.0)
MCV: 83 fL (ref 80.0–100.0)
Platelets: 382 10*3/uL (ref 150–400)
RBC: 4.01 MIL/uL (ref 3.87–5.11)
RDW: 14.2 % (ref 11.5–15.5)
WBC: 6.5 10*3/uL (ref 4.0–10.5)
nRBC: 0 % (ref 0.0–0.2)

## 2018-07-29 NOTE — ED Triage Notes (Signed)
Pt. Stated, I took a pregnancy test on Monday or Tuesday it was positive and then I started spotting and now its a continuous bleeding.   Called Joni Reining to report., Transported via wheelchair

## 2018-07-29 NOTE — MAU Note (Signed)
Colleen Shaw is a 23 y.o. at [redacted]w[redacted]d here in MAU reporting: + UPT on Monday. Started spotting on Monday and then it went away for 2 days and then started back heavier the other day. States today she has only worn one pad but yesterday was changing a pad every 10-15 min and it was mostly saturated. Reports abdominal pain, states today it is not as bad as it had been.  Onset of complaint: a couple day  Pain score: 5/10  Vitals:   07/29/18 0833  BP: 125/90  Pulse: 79  Resp: 18  Temp: 98.7 F (37.1 C)  SpO2: 100%     Lab orders placed from triage: UA, UPT

## 2018-07-29 NOTE — Discharge Instructions (Signed)
Return to care   If you have heavier bleeding that soaks through more that 2 pads per hour for an hour or more  If you bleed so much that you feel like you might pass out or you do pass out  If you have significant abdominal pain that is not improved with Tylenol      Threatened Miscarriage  A threatened miscarriage occurs when a woman has vaginal bleeding during the first 20 weeks of pregnancy but the pregnancy has not ended. If you have vaginal bleeding during this time, your health care provider will do tests to make sure you are still pregnant. If the tests show that you are still pregnant and that the developing baby (fetus) inside your uterus is still growing, your condition is considered a threatened miscarriage. A threatened miscarriage does not mean your pregnancy will end, but it does increase the risk of losing your pregnancy (complete miscarriage). What are the causes? The cause of this condition is usually not known. For women who go on to have a complete miscarriage, the most common cause is an abnormal number of chromosomes in the developing baby. Chromosomes are the structures inside cells that hold all of a person's genetic material. What increases the risk? The following lifestyle factors may increase your risk of a miscarriage in early pregnancy:  Smoking.  Drinking excessive amounts of alcohol or caffeine.  Recreational drug use. The following preexisting health conditions may increase your risk of a miscarriage in early pregnancy:  Polycystic ovary syndrome.  Uterine fibroids.  Infections.  Diabetes mellitus. What are the signs or symptoms? Symptoms of this condition include:  Vaginal bleeding.  Mild abdominal pain or cramps. How is this diagnosed? If you have bleeding with or without abdominal pain before 20 weeks of pregnancy, your health care provider will do tests to check whether you are still pregnant. These will include:  Ultrasound. This test  uses sound waves to create images of the inside of your uterus. This allows your health care provider to look at your developing baby and other structures, such as your placenta.  Pelvic exam. This is an internal exam of your vagina and cervix.  Measurement of your baby's heart rate.  Laboratory tests such as blood tests, urine tests, or swabs for infection You may be diagnosed with a threatened miscarriage if:  Ultrasound testing shows that you are still pregnant.  Your babys heart rate is strong.  A pelvic exam shows that the opening between your uterus and your vagina (cervix) is closed.  Blood tests confirm that you are still pregnant. How is this treated? No treatments have been shown to prevent a threatened miscarriage from going on to a complete miscarriage. However, the right home care is important. Follow these instructions at home:  Get plenty of rest.  Do not have sex or use tampons if you have vaginal bleeding.  Do not douche.  Do not smoke or use recreational drugs.  Do not drink alcohol.  Avoid caffeine.  Keep all follow-up prenatal visits as told by your health care provider. This is important. Contact a health care provider if:  You have light vaginal bleeding or spotting while pregnant.  You have abdominal pain or cramping.  You have a fever. Get help right away if:  You have heavy vaginal bleeding.  You have blood clots coming from your vagina.  You pass tissue from your vagina.  You leak fluid, or you have a gush of fluid from your vagina.  You have severe low back pain or abdominal cramps. °· You have fever, chills, and severe abdominal pain. °Summary °· A threatened miscarriage occurs when a woman has vaginal bleeding during the first 20 weeks of pregnancy but the pregnancy has not ended. °· The cause of a threatened miscarriage is usually not known. °· Symptoms of this condition may include vaginal bleeding and mild abdominal pain or cramps. °· No  treatments have been shown to prevent a threatened miscarriage from going on to a complete miscarriage. °· Keep all follow-up prenatal visits as told by your health care provider. This is important. °This information is not intended to replace advice given to you by your health care provider. Make sure you discuss any questions you have with your health care provider. °Document Released: 02/15/2005 Document Revised: 05/14/2016 Document Reviewed: 05/14/2016 °Elsevier Interactive Patient Education © 2019 [employer]vier Inc. ° ° ° ° ° °

## 2018-07-29 NOTE — MAU Provider Note (Signed)
Chief Complaint: Abdominal Pain and Vaginal Bleeding   First Provider Initiated Contact with Patient 07/29/18 [phone]     SUBJECTIVE HPI: Colleen Shaw is a 23 y.o. [id number] at [redacted]w[redacted]d who presents to [facility] reporting abdominal cramping & vaginal bleeding. Symptoms started earlier this week. Was initially spotting but that stopped until a few days ago. Bleeding had increased for the last 3 days. States yesterday she had to change her pad every 10-15 minutes. This morning the bleeding has decreased some & has not had to change her pad. Passed a clot yesterday that she says looked like a miscarriage.   Location: abdomen Quality: cramping Severity: 5/10 on pain scale Duration: 3 days Timing: intermittent Modifying factors: none Associated signs and symptoms: vaginal bleeding  Past Medical History:  Diagnosis Date  . Group beta Strep positive   . Pregnancy induced hypertension 2015   OB History  Gravida Para Term Preterm AB Living  4 3 2 1   3   SAB TAB Ectopic Multiple Live Births        0 3    # Outcome Date GA Lbr Len/2nd Weight Sex Delivery Anes PTL Lv  4 Current           3 Preterm 05/02/16 [redacted]w[redacted]d 19:30 / 00:40 2900 g F Vag-Spont EPI  LIV  2 Term 04/12/15 [redacted]w[redacted]d 13:40 / 00:10 2750 g F Vag-Spont EPI  LIV  1 Term 01/03/14     Vag-Spont   LIV   Past Surgical History:  Procedure Laterality Date  . NO PAST SURGERIES     Social History   Socioeconomic History  . Marital status: Single    Spouse name: Not on file  . Number of children: Not on file  . Years of education: Not on file  . Highest education level: Not on file  Occupational History  . Not on file  Social Needs  . Financial resource strain: Not on file  . Food insecurity:    Worry: Not on file    Inability: Not on file  . Transportation needs:    Medical: Not on file    Non-medical: Not on file  Tobacco Use  . Smoking status: Never Smoker  . Smokeless tobacco: Never Used  Substance and Sexual Activity   . Alcohol use: No  . Drug use: No  . Sexual activity: Yes    Birth control/protection: None  Lifestyle  . Physical activity:    Days per week: Not on file    Minutes per session: Not on file  . Stress: Not on file  Relationships  . Social connections:    Talks on phone: Not on file    Gets together: Not on file    Attends religious service: Not on file    Active member of club or organization: Not on file    Attends meetings of clubs or organizations: Not on file    Relationship status: Not on file  . Intimate partner violence:    Fear of current or ex partner: Not on file    Emotionally abused: Not on file    Physically abused: Not on file    Forced sexual activity: Not on file  Other Topics Concern  . Not on file  Social History Narrative  . Not on file   Family History  Problem Relation Age of Onset  . Hypertension Paternal Grandmother    No current facility-administered medications on file prior to encounter.    Current Outpatient Medications on File  Prior to Encounter  Medication Sig Dispense Refill  . amLODipine (NORVASC) 10 MG tablet Take 1 tablet (10 mg total) by mouth daily. 30 tablet 1   Allergies  Allergen Reactions  . 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   Reaction:  All over body swelling     I have reviewed patient's Past Medical Hx, Surgical Hx, Family Hx, Social Hx, medications and allergies.   Review of Systems  Constitutional: Negative.   Gastrointestinal: Positive for abdominal pain.  Genitourinary: Positive for vaginal bleeding.    OBJECTIVE Patient Vitals for the past 24 hrs:  BP Temp Temp src Pulse Resp SpO2 Height Weight  07/29/18 1200 134/89 - - - - - - -  07/29/18 1610 125/90 98.7 F (37.1 C) Oral 79 18 100 % - -  07/29/18 0827 - - - - - -  (1.549 m) 63.7 kg   Constitutional: Well-developed, well-nourished female in no acute distress.  Cardiovascular: normal rate & rhythm, no  murmur Respiratory: normal rate and effort. Lung sounds clear throughout GI: Abd soft, non-tender, Pos BS x 4. No guarding or rebound tenderness MS: Extremities nontender, no edema, normal ROM Neurologic: Alert and oriented x 4.  GU:     SPECULUM EXAM: NEFG, physiologic discharge, small amount of dark red blood. Cervix pink/smooth.   BIMANUAL: No CMT. cervix closed; uterus normal size, no adnexal tenderness or masses.    LAB RESULTS Results for orders placed or performed during the hospital encounter of 07/29/18 (from the past 24 hour(s))  Urinalysis, Routine w reflex microscopic     Status: Abnormal   Collection Time: 07/29/18  8:24 AM  Result Value Ref Range   Color, Urine YELLOW YELLOW   APPearance HAZY (A) CLEAR   Specific Gravity, Urine 1.026 1.005 - 1.030   pH 6.0 5.0 - 8.0   Glucose, UA NEGATIVE NEGATIVE mg/dL   Hgb urine dipstick LARGE (A) NEGATIVE   Bilirubin Urine NEGATIVE NEGATIVE   Ketones, ur NEGATIVE NEGATIVE mg/dL   Protein, ur 30 (A) NEGATIVE mg/dL   Nitrite NEGATIVE NEGATIVE   Leukocytes,Ua NEGATIVE NEGATIVE   RBC / HPF >50 (H) 0 - 5 RBC/hpf   WBC, UA 6-10 0 - 5 WBC/hpf   Bacteria, UA NONE SEEN NONE SEEN   Squamous Epithelial / LPF 0-5 0 - 5   Mucus PRESENT    Hyaline Casts, UA PRESENT   Pregnancy, urine POC     Status: Abnormal   Collection Time: 07/29/18  8:25 AM  Result Value Ref Range   Preg Test, Ur POSITIVE (A) NEGATIVE  Wet prep, genital     Status: Abnormal   Collection Time: 07/29/18  9:07 AM  Result Value Ref Range   Yeast Wet Prep HPF POC NONE SEEN NONE SEEN   Trich, Wet Prep NONE SEEN NONE SEEN   Clue Cells Wet Prep HPF POC NONE SEEN NONE SEEN   WBC, Wet Prep HPF POC RARE (A) NONE SEEN   Sperm NONE SEEN   CBC     Status: Abnormal   Collection Time: 07/29/18  9:11 AM  Result Value Ref Range   WBC 6.5 4.0 - 10.5 K/uL   RBC 4.01 3.87 - 5.11 MIL/uL   Hemoglobin 10.9 (L) 12.0 - 15.0 g/dL   HCT 96.0 (L) 45.4 - 09.8 %   MCV 83.0 80.0 - 100.0 fL    MCH 27.2 26.0 - 34.0 pg   MCHC 32.7 30.0 -  36.0 g/dL   RDW 09.814.2 11.911.5 - 14.715.5 %   Platelets 382 150 - 400 K/uL   nRBC 0.0 0.0 - 0.2 %  hCG, quantitative, pregnancy     Status: Abnormal   Collection Time: 07/29/18  9:11 AM  Result Value Ref Range   hCG, Beta Chain, Quant, S 4,551 (H) <5 mIU/mL    IMAGING [place] Ob Less Than 14 Weeks With Ob Transvaginal  Result Date: 07/29/2018 CLINICAL DATA:  Vaginal bleeding, pelvic pain, beta HCG 4551 EXAM: OBSTETRIC <14 WK US AND TRANSVAGINAL OB US TECHNIQUE: Both transabdominal and transvaginal ultrasound examinations were performed for complete evaluation of the gestation as well as the maternal uterus, adnexal regions, and pelvic cul-de-sac. Transvaginal technique was performed to assess early pregnancy. COMPARISON:  None. FINDINGS: Intrauterine gestational sac: None Maternal uterus/adnexae: Endometrial complex measures 15 mm. Left ovary measures 3.9 x 2.6 x 3.0 cm and is notable for a 2.8 cm simple corpus luteal cyst. Right ovary measures 2.7 x 2.0 x 1.7 cm. No free fluid. IMPRESSION: No IUP is visualized. By definition, in the setting of a positive pregnancy test, this reflects a pregnancy of unknown location. Differential considerations include abnormal IUP/missed abortion or nonvisualized ectopic pregnancy. Early normal IUP is not considered to be within the standard differential given the beta HCG. Correlate with follow-up beta hCG to confirm appropriate decline. Consider repeat pelvic ultrasound in 7-10 days if clinically warranted. Electronically Signed   By: Charline BillsSriyesh  Krishnan M.D.   On: 07/29/2018 11:37    MAU COURSE Orders Placed This Encounter  Procedures  . Wet prep, genital  . US OB LESS THAN 14 WEEKS WITH OB TRANSVAGINAL  . Urinalysis, Routine w reflex microscopic  . CBC  . hCG, quantitative, pregnancy  . Pregnancy, urine POC  . Discharge patient   No orders of the defined types were placed in this encounter.   MDM +UPT UA, wet prep,  GC/chlamydia, CBC, ABO/Rh, quant hCG, and US today to rule out ectopic pregnancy RH positive  Ultrasound shows no IUP or adnexal mass. HCG 4551 Likely miscarriage based on patient's description of tissue she passed yesterday but can't exclude ectopic. Will bring patient to office for repeat HCG on Monday.   ASSESSMENT 1. Pregnancy of unknown anatomic location   2. Vaginal bleeding in pregnancy, first trimester   3. Abdominal pain during pregnancy in first trimester     PLAN Discharge home in stable condition. SAB vs Ectopic precautions Pt scheduled for f/u HCG on Monday  Follow-up Information    [facility].   Specialty:  Obstetrics and Gynecology Contact information: 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   Reaction:  All over body swelling       Medication List    TAKE these medications   amLODipine 10 MG tablet Commonly known as:  NORVASC Take 1 tablet (10 mg total) by mouth daily.        Judeth HornLawrence, Nyah Shepherd, NP 07/29/2018  12:26 PM

## 2018-07-31 ENCOUNTER — Ambulatory Visit: Payer: Medicaid Other

## 2018-08-01 ENCOUNTER — Ambulatory Visit (INDEPENDENT_AMBULATORY_CARE_PROVIDER_SITE_OTHER): Payer: Medicaid Other

## 2018-08-01 ENCOUNTER — Other Ambulatory Visit: Payer: Self-pay

## 2018-08-01 DIAGNOSIS — O3680X0 Pregnancy with inconclusive fetal viability, not applicable or unspecified: Secondary | ICD-10-CM

## 2018-08-01 LAB — GC/CHLAMYDIA PROBE AMP (~~LOC~~) NOT AT ARMC
Chlamydia: NEGATIVE
Neisseria Gonorrhea: NEGATIVE

## 2018-08-01 LAB — BETA HCG QUANT (REF LAB): hCG Quant: 881 m[IU]/mL

## 2018-08-01 NOTE — Progress Notes (Signed)
Pt here today for STAT Beta Lab.  Pt reports denies any pain or vaginal bleeding.  Pt advised that it will take approximately two hours for results and that we will call her with f/u.  Pt agreed.   Received beta level from [facility] of 881.  Notified Luna Kitchens, CNM who recommends that the pt comes back weekly for beta levels to zero.  Pt informed of provider's recommendation.  I also advised pt that if her pain intensifies or if her bleeding increases to please go to MAU.  Pt stated understanding and that she will be able to come in on 08/08/18 @ 1100 for non beta stat.  Front office notified to schedule appt.   Addison Naegeli, RN 08/01/18

## 2018-08-03 ENCOUNTER — Encounter: Payer: Self-pay | Admitting: Student

## 2018-08-03 DIAGNOSIS — O039 Complete or unspecified spontaneous abortion without complication: Secondary | ICD-10-CM | POA: Insufficient documentation

## 2018-08-03 NOTE — Progress Notes (Signed)
Reviewed appropriate drop in quant from 4,000 to 800. Patient denies pain or bleeding but was given strict return precautions to MAU.  Chart reviewed for nurse visit. Agree with plan of care.   Marylene Land, CNM 08/03/2018 11:06 AM

## 2018-08-04 ENCOUNTER — Other Ambulatory Visit: Payer: Self-pay | Admitting: *Deleted

## 2018-08-04 DIAGNOSIS — O3680X0 Pregnancy with inconclusive fetal viability, not applicable or unspecified: Secondary | ICD-10-CM

## 2018-08-08 ENCOUNTER — Other Ambulatory Visit: Payer: Medicaid Other

## 2018-08-09 ENCOUNTER — Encounter: Payer: Self-pay | Admitting: *Deleted

## 2018-11-03 ENCOUNTER — Emergency Department (HOSPITAL_COMMUNITY)
Admission: EM | Admit: 2018-11-03 | Discharge: 2018-11-04 | Discharge status: Absent without leave | Payer: Medicaid Other

## 2018-11-03 NOTE — ED Notes (Signed)
Brought pt back to triage and now pt wants to leave.

## 2020-01-17 ENCOUNTER — Ambulatory Visit (HOSPITAL_COMMUNITY)
Admission: EM | Admit: 2020-01-17 | Discharge: 2020-01-17 | Disposition: A | Discharge status: Home / Self Care | Payer: Medicaid Other | Attending: Emergency Medicine | Admitting: Emergency Medicine

## 2020-01-17 ENCOUNTER — Other Ambulatory Visit: Payer: Self-pay

## 2020-01-17 ENCOUNTER — Encounter (HOSPITAL_COMMUNITY): Payer: Self-pay

## 2020-01-17 DIAGNOSIS — N611 Abscess of the breast and nipple: Secondary | ICD-10-CM

## 2020-01-17 MED ORDER — IBUPROFEN 800 MG PO TABS: 800.0000 mg | ORAL_TABLET | Freq: Three times a day (TID) | ORAL | 0 refills | Status: DC

## 2020-01-17 MED ORDER — DOXYCYCLINE HYCLATE 100 MG PO CAPS: 100.0000 mg | ORAL_CAPSULE | Freq: Two times a day (BID) | ORAL | 0 refills | Status: AC

## 2020-01-17 NOTE — Discharge Instructions (Signed)
Begin doxycycline twice daily for the next 10 days Tylenol and ibuprofen for pain Warm compresses to area Breast center will contact you, follow-up if symptoms not resolving with antibiotic alone as this abscess may need to be drained

## 2020-01-17 NOTE — ED Triage Notes (Addendum)
Pt in with c/o left nipple pain for the past 3 days.  Has been taken tylenol with no relief  Hardness around left Areola noted  Denies any warmth or redness to area  Pt also requesting PCP referral

## 2020-01-17 NOTE — ED Provider Notes (Signed)
[facility]    CSN: [id number] Arrival date & time: 01/17/20  1120      History   Chief Complaint Chief Complaint  Patient presents with  . nipple pain    HPI Teriann Livingood is a 24 y.o. female presenting today for evaluation of left elbow pain.  Reports over the past 3 days she has had increased pain around her left nipple.  She denies any injury or trauma.  Denies any drainage or discoloration.  She denies fevers.  Denies history of similar.  Denies currently breast-feeding.  HPI  Past Medical History:  Diagnosis Date  . Group beta Strep positive   . Pregnancy induced hypertension 2015    Patient Active Problem List   Diagnosis Date Noted  . Miscarriage 08/03/2018  . Normal labor 05/02/2016  . Supervision of high risk pregnancy, antepartum 02/19/2016  . Hx of preeclampsia, prior pregnancy, currently pregnant 02/19/2016  . Late prenatal care affecting pregnancy, antepartum 02/19/2016  . Vaginal bleeding before [redacted] weeks gestation 12/25/2015    Past Surgical History:  Procedure Laterality Date  . NO PAST SURGERIES      OB History    Gravida  4   Para  3   Term  2   Preterm  1   AB      Living  3     SAB      TAB      Ectopic      Multiple  0   Live Births  3            Home Medications    Prior to Admission medications   Medication Sig Start Date End Date Taking? Authorizing Provider  amLODipine (NORVASC) 10 MG tablet Take 1 tablet (10 mg total) by mouth daily. 05/09/16   Rasch, Victorino Dike I, NP  doxycycline (VIBRAMYCIN) 100 MG capsule Take 1 capsule (100 mg total) by mouth 2 (two) times daily for 10 days. 01/17/20 01/27/20  Dartagnan Beavers C, PA-C  ibuprofen (ADVIL) 800 MG tablet Take 1 tablet (800 mg total) by mouth 3 (three) times daily. 01/17/20   Leila Schuff, Junius Creamer, PA-C    Family History Family History  Problem Relation Age of Onset  . Hypertension Paternal Grandmother     Social History Social History   Tobacco Use   . Smoking status: Never Smoker  . Smokeless tobacco: Never Used  Vaping Use  . Vaping Use: Never used  Substance Use Topics  . Alcohol use: No  . Drug use: No     Allergies   Food   Review of Systems Review of Systems  Constitutional: Negative for fatigue and fever.  Eyes: Negative for visual disturbance.  Respiratory: Negative for shortness of breath.   Cardiovascular: Negative for chest pain.  Gastrointestinal: Negative for abdominal pain, nausea and vomiting.  Musculoskeletal: Negative for arthralgias and joint swelling.  Skin: Positive for color change. Negative for rash and wound.  Neurological: Negative for dizziness, weakness, light-headedness and headaches.     Physical Exam Triage Vital Signs ED Triage Vitals  Enc Vitals Group     BP 01/17/20 1153 (!) 156/98     Pulse Rate 01/17/20 1153 77     Resp 01/17/20 1153 19     Temp 01/17/20 1153 99.9 F (37.7 C)     Temp Source 01/17/20 1153 Oral     SpO2 01/17/20 1153 100 %     Weight --      Height --  Head Circumference --      Peak Flow --      Pain Score 01/17/20 1152 10     Pain Loc --      Pain Edu? --      Excl. in GC? --    No data found.  Updated Vital Signs BP (!) 156/98 (BP Location: Left Arm)   Pulse 77   Temp 99.9 F (37.7 C) (Oral)   Resp 19   LMP 01/09/2020 (Approximate)   SpO2 100%   Breastfeeding No   Visual Acuity Right Eye Distance:   Left Eye Distance:   Bilateral Distance:    Right Eye Near:   Left Eye Near:    Bilateral Near:     Physical Exam Vitals and nursing note reviewed.  Constitutional:      Appearance: She is well-developed.     Comments: No acute distress  HENT:     Head: Normocephalic and atraumatic.     Nose: Nose normal.  Eyes:     Conjunctiva/sclera: Conjunctivae normal.  Cardiovascular:     Rate and Rhythm: Normal rate.  Pulmonary:     Effort: Pulmonary effort is normal. No respiratory distress.  Chest:       Comments: Erythema noted to  inferior portion of breast along lower areola border, associated induration noted deeper into breast tissue Abdominal:     General: There is no distension.  Musculoskeletal:        General: Normal range of motion.     Cervical back: Neck supple.  Skin:    General: Skin is warm and dry.  Neurological:     Mental Status: She is alert and oriented to person, place, and time.      UC Treatments / Results  Labs (all labs ordered are listed, but only abnormal results are displayed) Labs Reviewed - No data to display  EKG   Radiology No results found.  Procedures Procedures (including critical care time)  Medications Ordered in UC Medications - No data to display  Initial Impression / Assessment and Plan / UC Course  I have reviewed the triage vital signs and the nursing notes.  Pertinent labs & imaging results that were available during my care of the patient were reviewed by me and considered in my medical decision making (see chart for details).     Left breast abscess-initiated on doxycycline, recommending warm compresses and anti-inflammatories.  Placing referral to breast center as patient name may need ultrasound-guided aspiration of this abscess.  Low-grade fever in clinic today.  No systemic symptoms.  Discussed strict return precautions. Patient verbalized understanding and is agreeable with plan.  Final Clinical Impressions(s) / UC Diagnoses   Final diagnoses:  Left breast abscess     Discharge Instructions     Begin doxycycline twice daily for the next 10 days Tylenol and ibuprofen for pain Warm compresses to area Breast center will contact you, follow-up if symptoms not resolving with antibiotic alone as this abscess may need to be drained     ED Prescriptions    Medication Sig Dispense Auth. Provider   ibuprofen (ADVIL) 800 MG tablet Take 1 tablet (800 mg total) by mouth 3 (three) times daily. 21 tablet Trevionne Advani C, PA-C   doxycycline  (VIBRAMYCIN) 100 MG capsule Take 1 capsule (100 mg total) by mouth 2 (two) times daily for 10 days. 20 capsule Roshan Salamon, [place] C, PA-C     PDMP not reviewed this encounter.   [place] Blucher, [place] C, PA-C  01/17/20 1259  

## 2020-01-22 ENCOUNTER — Other Ambulatory Visit: Payer: Self-pay | Admitting: Emergency Medicine

## 2020-01-22 DIAGNOSIS — N611 Abscess of the breast and nipple: Secondary | ICD-10-CM

## 2020-01-29 ENCOUNTER — Other Ambulatory Visit: Payer: Medicaid Other

## 2020-04-15 IMAGING — US OBSTETRIC <14 WK US AND TRANSVAGINAL OB US
1 series · 15 of 28 positions shown · non-contrast
Comparison: None.

CLINICAL DATA: Vaginal bleeding, pelvic pain, beta HCG 1775

EXAM:
OBSTETRIC <14 WK US AND TRANSVAGINAL OB US
TECHNIQUE: Both transabdominal and transvaginal ultrasound examinations were
performed for complete evaluation of the gestation as well as the
maternal uterus, adnexal regions, and pelvic cul-de-sac.
Transvaginal technique was performed to assess early pregnancy.

[Series 1: obstetric <14 wk us and transvaginal ob us · 15 of 66 slices shown]
[im 1/66]
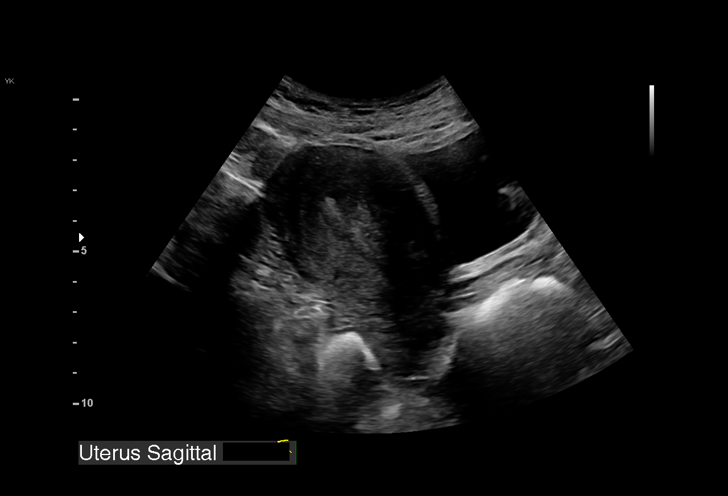
[im 5/66]
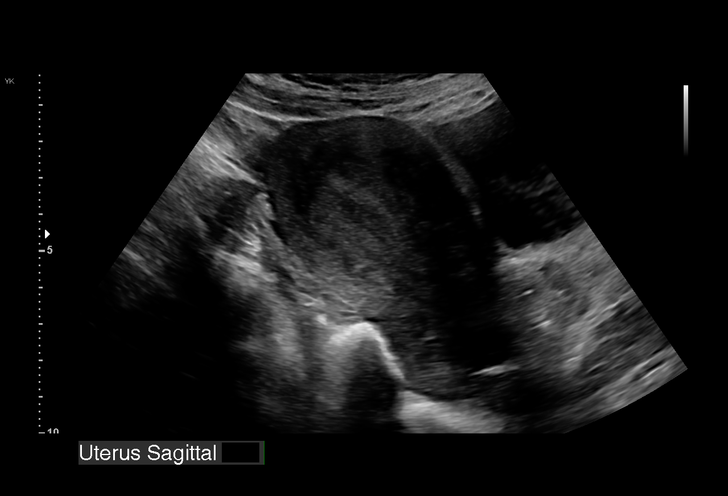
[im 10/66]
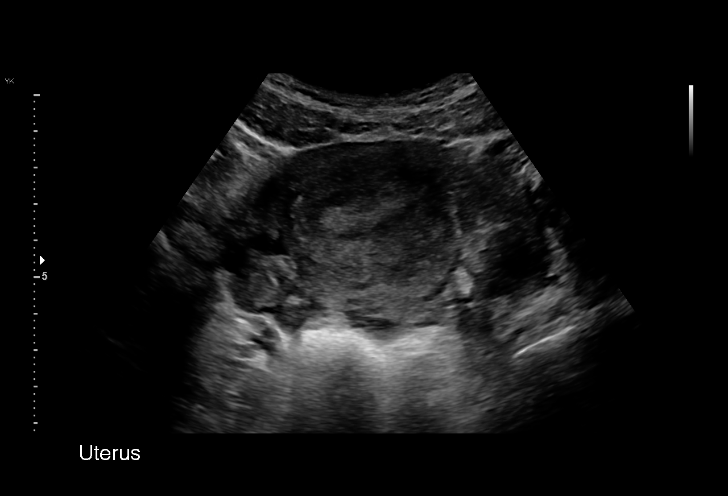
[im 15/66]
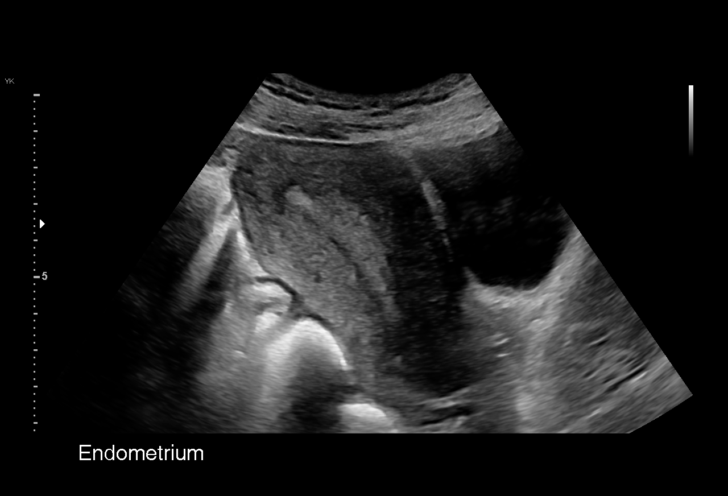
[im 20/66]
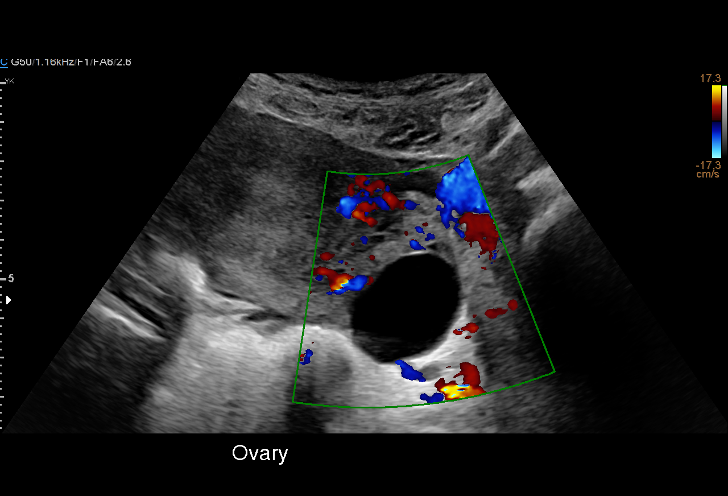
[im 25/66]
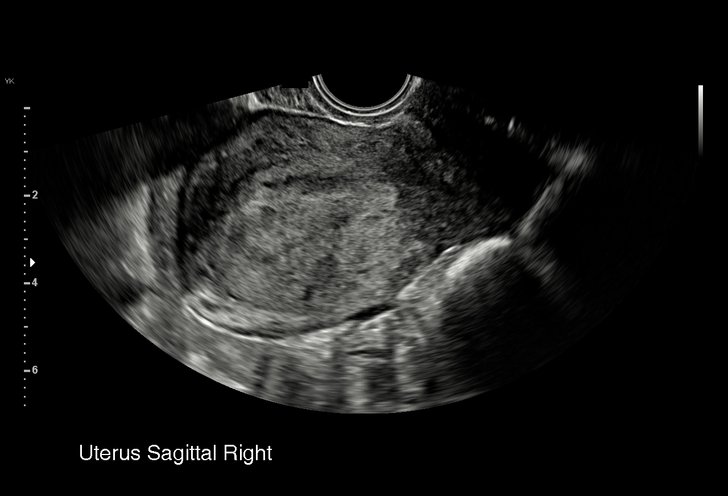
[im 29/66]
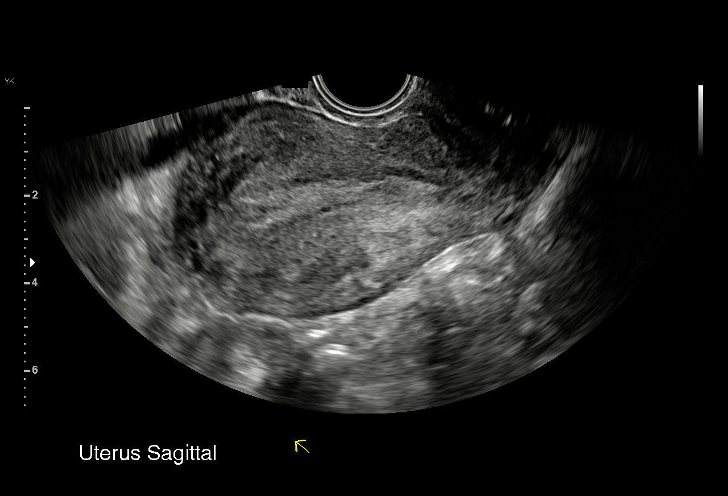
[im 34/66]
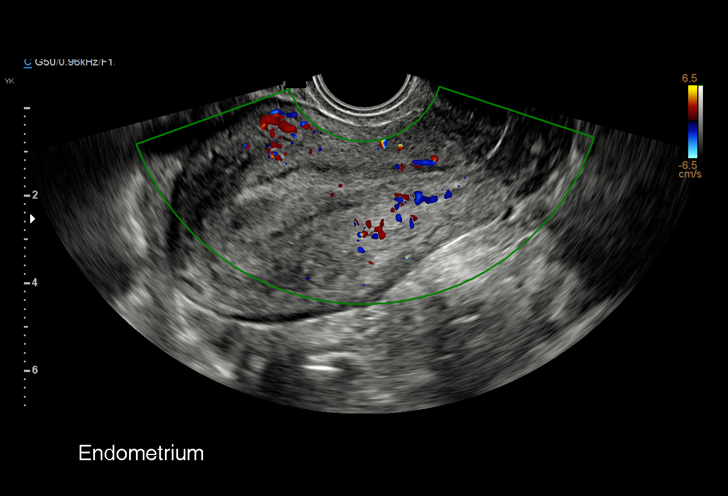
[im 37/66]
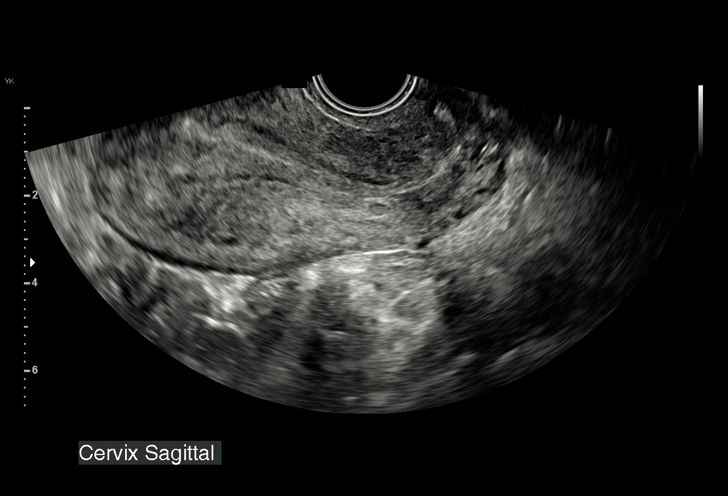
[im 41/66]
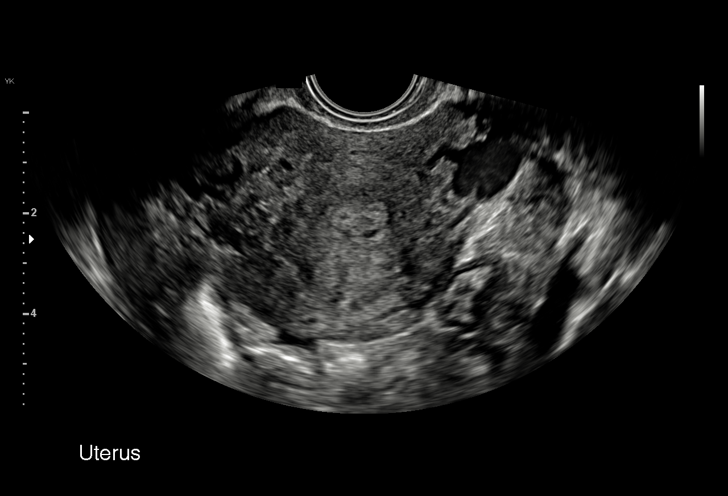
[im 46/66]
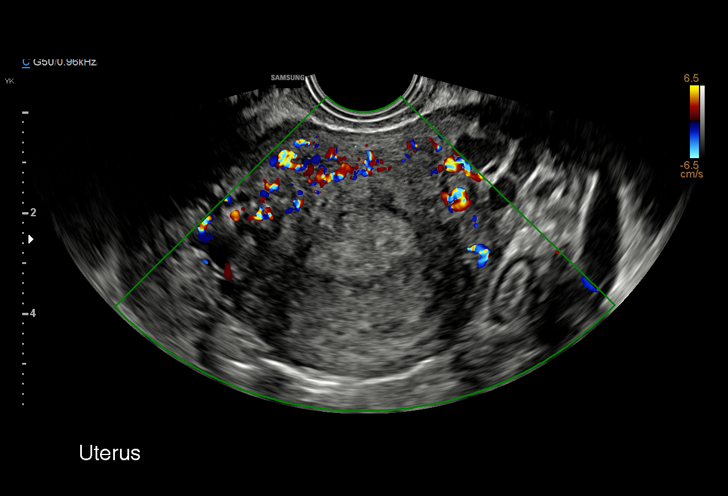
[im 51/66]
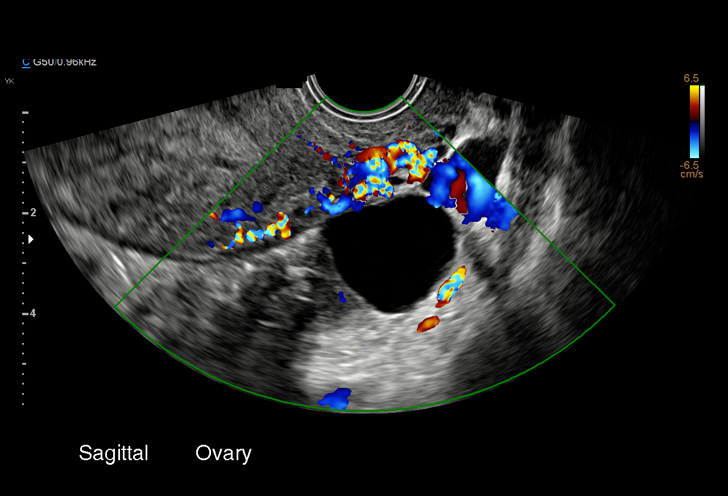
[im 56/66]
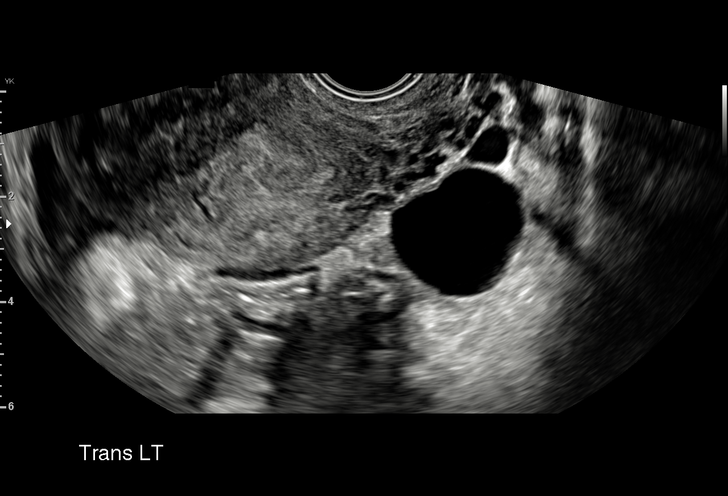
[im 61/66]
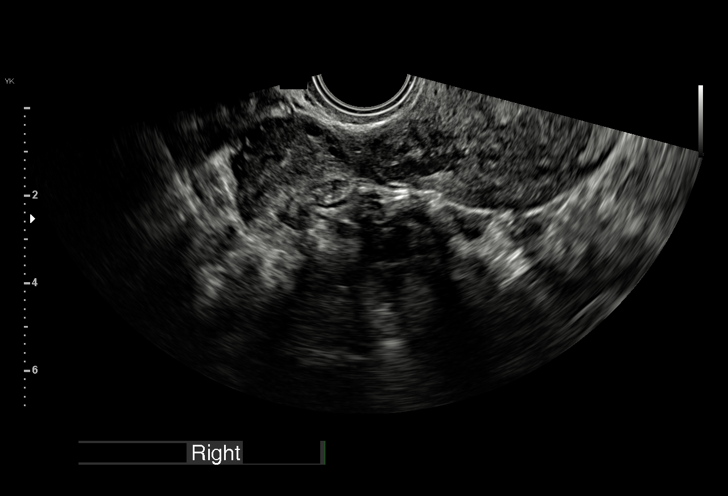
[im 66/66]
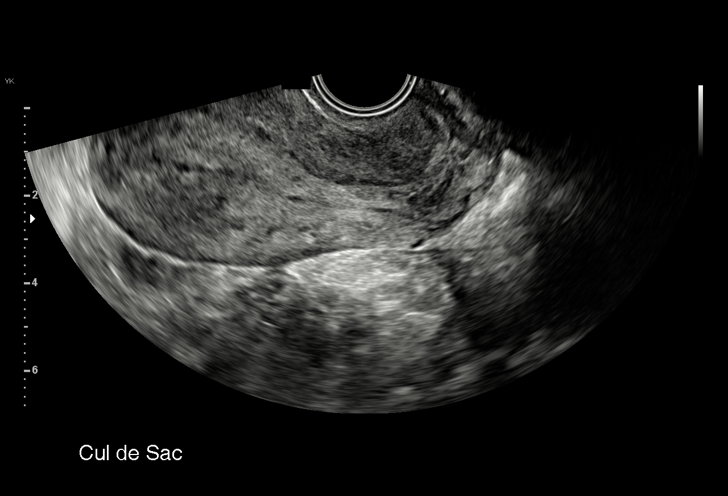

[15 of 28 positions shown; findings below may reference images not displayed]

FINDINGS: Intrauterine gestational sac: None

Maternal uterus/adnexae: Endometrial complex measures 15 mm.

Left ovary measures 3.9 x 2.6 x 3.0 cm and is notable for a 2.8 cm
simple corpus luteal cyst.

Right ovary measures 2.7 x 2.0 x 1.7 cm.

No free fluid.
IMPRESSION: No IUP is visualized.

By definition, in the setting of a positive pregnancy test, this
reflects a pregnancy of unknown location. Differential
considerations include abnormal IUP/missed abortion or nonvisualized
ectopic pregnancy. Early normal IUP is not considered to be within
the standard differential given the beta HCG.

Correlate with follow-up beta hCG to confirm appropriate decline.
Consider repeat pelvic ultrasound in 7-10 days if clinically
warranted.

## 2020-08-20 ENCOUNTER — Encounter (HOSPITAL_COMMUNITY): Payer: Self-pay

## 2020-08-20 ENCOUNTER — Other Ambulatory Visit: Payer: Self-pay

## 2020-08-20 ENCOUNTER — Ambulatory Visit (HOSPITAL_COMMUNITY)
Admission: EM | Admit: 2020-08-20 | Discharge: 2020-08-20 | Disposition: A | Discharge status: Home / Self Care | Payer: Medicaid Other | Attending: Physician Assistant | Admitting: Physician Assistant

## 2020-08-20 DIAGNOSIS — H00011 Hordeolum externum right upper eyelid: Secondary | ICD-10-CM | POA: Diagnosis not present

## 2020-08-20 MED ORDER — ERYTHROMYCIN 5 MG/GM OP OINT: TOPICAL_OINTMENT | OPHTHALMIC | 0 refills | Status: DC

## 2020-08-20 NOTE — ED Provider Notes (Signed)
[facility]    CSN: [id number] Arrival date & time: 08/20/20  1500      History   Chief Complaint Chief Complaint  Patient presents with   Eye Pain/Swelling    HPI Colleen Shaw is a 25 y.o. female.   Patient here c/w R upper eyelid pain x 3 days.  Denies vision changes, blurry vision, double vision, photophobia, discharge, foreign body sensation, painful EOM, f/c, URI sx, cough, wheezing, SOB.  No advil or tylenol today.   Past Medical History:  Diagnosis Date   Group beta Strep positive    Pregnancy induced hypertension 2015    Patient Active Problem List   Diagnosis Date Noted   Miscarriage 08/03/2018   Normal labor 05/02/2016   Supervision of high risk pregnancy, antepartum 02/19/2016   Hx of preeclampsia, prior pregnancy, currently pregnant 02/19/2016   Late prenatal care affecting pregnancy, antepartum 02/19/2016   Vaginal bleeding before [redacted] weeks gestation 12/25/2015    Past Surgical History:  Procedure Laterality Date   NO PAST SURGERIES      OB History     Gravida  4   Para  3   Term  2   Preterm  1   AB      Living  3      SAB      IAB      Ectopic      Multiple  0   Live Births  3            Home Medications    Prior to Admission medications   Medication Sig Start Date End Date Taking? Authorizing Provider  erythromycin ophthalmic ointment Place a 1/2 inch ribbon of ointment into the right lower eyelid. 08/20/20  Yes Evern Core, PA-C  amLODipine (NORVASC) 10 MG tablet Take 1 tablet (10 mg total) by mouth daily. 05/09/16   Rasch, Victorino Dike I, NP  ibuprofen (ADVIL) 800 MG tablet Take 1 tablet (800 mg total) by mouth 3 (three) times daily. 01/17/20   Wieters, Junius Creamer, PA-C    Family History Family History  Problem Relation Age of Onset   Hypertension Paternal Grandmother     Social History Social History   Tobacco Use   Smoking status: Never   Smokeless tobacco: Never  Vaping Use   Vaping Use: Never  used  Substance Use Topics   Alcohol use: No   Drug use: No     Allergies   Food   Review of Systems Review of Systems  Constitutional:  Negative for chills, fatigue and fever.  HENT:  Negative for congestion, ear pain, nosebleeds, postnasal drip, rhinorrhea, sinus pressure, sinus pain and sore throat.   Eyes:  Negative for photophobia, pain, discharge, redness, itching and visual disturbance.  Respiratory:  Negative for cough, shortness of breath and wheezing.   Gastrointestinal:  Negative for abdominal pain, diarrhea, nausea and vomiting.  Musculoskeletal:  Positive for myalgias (R upper eyelid). Negative for arthralgias.  Skin:  Negative for rash.  Neurological:  Negative for light-headedness and headaches.  Hematological:  Negative for adenopathy. Does not bruise/bleed easily.  Psychiatric/Behavioral:  Negative for confusion and sleep disturbance.     Physical Exam Triage Vital Signs ED Triage Vitals  Enc Vitals Group     BP 08/20/20 1520 (!) 138/94     Pulse Rate 08/20/20 1520 77     Resp 08/20/20 1520 18     Temp 08/20/20 1520 98.5 F (36.9 C)     Temp src --  SpO2 08/20/20 1520 99 %     Weight --      Height --      Head Circumference --      Peak Flow --      Pain Score 08/20/20 1517 10     Pain Loc --      Pain Edu? --      Excl. in [place]? --    No data found.  Updated Vital Signs BP (!) 138/94   Pulse 77   Temp 98.5 F (36.9 C)   Resp 18   LMP 07/30/2020 (Approximate)   SpO2 99%   Visual Acuity Right Eye Distance:   Left Eye Distance:   Bilateral Distance:    Right Eye Near:   Left Eye Near:    Bilateral Near:     Physical Exam Vitals and nursing note reviewed.  Constitutional:      General: She is not in acute distress.    Appearance: Normal appearance. She is not ill-appearing.  HENT:     Head: Normocephalic and atraumatic.     Right Ear: Tympanic membrane and ear canal normal.     Left Ear: Tympanic membrane and ear canal normal.      Nose: No congestion or rhinorrhea.     Mouth/Throat:     Pharynx: No oropharyngeal exudate or posterior oropharyngeal erythema.  Eyes:     General: No scleral icterus.       Right eye: Hordeolum (upper) present. No foreign body or discharge.     Extraocular Movements: Extraocular movements intact.     Right eye: Normal extraocular motion.     Conjunctiva/sclera: Conjunctivae normal.     Right eye: Right conjunctiva is not injected. No exudate.    Comments: Strabismus, L eye  Cardiovascular:     Rate and Rhythm: Normal rate and regular rhythm.     Heart sounds: No murmur heard. Pulmonary:     Effort: Pulmonary effort is normal. No respiratory distress.     Breath sounds: Normal breath sounds. No wheezing or rales.  Musculoskeletal:     Cervical back: Normal range of motion. No rigidity.  Skin:    Capillary Refill: Capillary refill takes less than 2 seconds.     Coloration: Skin is not jaundiced.     Findings: No rash.  Neurological:     General: No focal deficit present.     Mental Status: She is alert and oriented to person, place, and time.     Motor: No weakness.     Gait: Gait normal.  Psychiatric:        Mood and Affect: Mood normal.        Behavior: Behavior normal.     UC Treatments / Results  Labs (all labs ordered are listed, but only abnormal results are displayed) Labs Reviewed - No data to display  EKG   Radiology No results found.  Procedures Procedures (including critical care time)  Medications Ordered in UC Medications - No data to display  Initial Impression / Assessment and Plan / UC Course  I have reviewed the triage vital signs and the nursing notes.  Pertinent labs & imaging results that were available during my care of the patient were reviewed by me and considered in my medical decision making (see chart for details).     Use warm compress on eye 10 minutes 4 times per day Use ointment as prescribed Follow up with ophthalmologist  if no improvement in 1 week Final Clinical  Impressions(s) / UC Diagnoses   Final diagnoses:  Hordeolum externum of right upper eyelid     Discharge Instructions      Use warm compress 10 minutes 4 times per day   ED Prescriptions     Medication Sig Dispense Auth. Provider   erythromycin ophthalmic ointment Place a 1/2 inch ribbon of ointment into the right lower eyelid. 3.5 g Evern Core, PA-C      PDMP not reviewed this encounter.   Evern Core, PA-C 08/20/20 1537

## 2020-08-20 NOTE — Discharge Instructions (Addendum)
Use warm compress 10 minutes 4 times per day

## 2020-08-20 NOTE — ED Triage Notes (Signed)
Pt with eyelid swelling, eye pain, watery drainage from eye for 3 days. Reports may have gotten eyelash glue in eye.

## 2021-02-15 ENCOUNTER — Other Ambulatory Visit: Payer: Self-pay

## 2021-02-15 ENCOUNTER — Ambulatory Visit (INDEPENDENT_AMBULATORY_CARE_PROVIDER_SITE_OTHER): Payer: Medicaid Other

## 2021-02-15 ENCOUNTER — Encounter (HOSPITAL_COMMUNITY): Payer: Self-pay

## 2021-02-15 ENCOUNTER — Ambulatory Visit (HOSPITAL_COMMUNITY)
Admission: EM | Admit: 2021-02-15 | Discharge: 2021-02-15 | Disposition: A | Discharge status: Home / Self Care | Payer: Medicaid Other | Attending: Family Medicine | Admitting: Family Medicine

## 2021-02-15 ENCOUNTER — Telehealth (HOSPITAL_COMMUNITY): Payer: Self-pay | Admitting: Emergency Medicine

## 2021-02-15 DIAGNOSIS — R072 Precordial pain: Secondary | ICD-10-CM

## 2021-02-15 MED ORDER — IBUPROFEN 800 MG PO TABS: 800.0000 mg | ORAL_TABLET | Freq: Three times a day (TID) | ORAL | 0 refills | Status: DC | PRN

## 2021-02-15 NOTE — Telephone Encounter (Signed)
Patient called reporting the pharmacy was closed.  Called patient, verified with 2 identifiers.  Patient requested the [facility] on [address]

## 2021-02-15 NOTE — Discharge Instructions (Addendum)
Take ibuprofen 800 mg 3 times daily as needed for pain. Warm compresses or a heating pad to the painful area.  Your blood pressure was a little elevated today. Please establish care with a primary doctor to recheck your blood pressure and see if you need treatment for that.

## 2021-02-15 NOTE — ED Provider Notes (Signed)
[facility]    CSN: [id number] Arrival date & time: 02/15/21  1046      History   Chief Complaint Chief Complaint  Patient presents with   Chest Pain    HPI Colleen Shaw is a 25 y.o. female.    Chest Pain Here for upper anterior cp. 2 days ago she felt a pain in her central anterior chest; it lasted about an hour, was relieved after she had taken some tylenol and applied vaporub.  Then yesterday she had the same pain, and has not been relieved by anything. Maybe bothers her more on deep inspiration. No URI symptoms, f/c, n/v/d. Does note some stress (has 3 girls, and is a Consulting civil engineer).  No burping or indigestion.   Past Medical History:  Diagnosis Date   Group beta Strep positive    Pregnancy induced hypertension 2015    Patient Active Problem List   Diagnosis Date Noted   Miscarriage 08/03/2018   Normal labor 05/02/2016   Supervision of high risk pregnancy, antepartum 02/19/2016   Hx of preeclampsia, prior pregnancy, currently pregnant 02/19/2016   Late prenatal care affecting pregnancy, antepartum 02/19/2016   Vaginal bleeding before [redacted] weeks gestation 12/25/2015    Past Surgical History:  Procedure Laterality Date   NO PAST SURGERIES      OB History     Gravida  4   Para  3   Term  2   Preterm  1   AB      Living  3      SAB      IAB      Ectopic      Multiple  0   Live Births  3            Home Medications    Prior to Admission medications   Medication Sig Start Date End Date Taking? Authorizing Provider  ibuprofen (ADVIL) 800 MG tablet Take 1 tablet (800 mg total) by mouth every 8 (eight) hours as needed (pain). 02/15/21   Zenia Resides, MD    Family History Family History  Problem Relation Age of Onset   Hypertension Paternal Grandmother     Social History Social History   Tobacco Use   Smoking status: Never   Smokeless tobacco: Never  Vaping Use   Vaping Use: Never used  Substance Use Topics    Alcohol use: No   Drug use: No     Allergies   Food   Review of Systems Review of Systems  Cardiovascular:  Positive for chest pain.    Physical Exam Triage Vital Signs ED Triage Vitals  Enc Vitals Group     BP 02/15/21 1222 (!) 146/94     Pulse Rate 02/15/21 1222 78     Resp 02/15/21 1222 16     Temp 02/15/21 1225 98.5 F (36.9 C)     Temp Source 02/15/21 1225 Oral     SpO2 02/15/21 1222 100 %     Weight --      Height --      Head Circumference --      Peak Flow --      Pain Score 02/15/21 1220 6     Pain Loc --      Pain Edu? --      Excl. in [place]? --    No data found.  Updated Vital Signs BP (!) 146/94 (BP Location: Right Arm) Comment: States she has not had her BP medications in years.  States the last time she had her medication was in 2018. Pt does not have a PCP.   Pulse 78    Temp 98.5 F (36.9 C) (Oral)    Resp 16    LMP 01/22/2021 (Approximate)    SpO2 100%   Visual Acuity Right Eye Distance:   Left Eye Distance:   Bilateral Distance:    Right Eye Near:   Left Eye Near:    Bilateral Near:     Physical Exam Vitals reviewed.  Constitutional:      General: She is not in acute distress.    Appearance: She is not toxic-appearing.  HENT:     Nose: Nose normal.     Mouth/Throat:     Mouth: Mucous membranes are moist.     Pharynx: No oropharyngeal exudate or posterior oropharyngeal erythema.  Eyes:     Extraocular Movements: Extraocular movements intact.     Conjunctiva/sclera: Conjunctivae normal.     Pupils: Pupils are equal, round, and reactive to light.  Cardiovascular:     Rate and Rhythm: Normal rate and regular rhythm.     Heart sounds: No murmur heard. Pulmonary:     Effort: Pulmonary effort is normal.     Breath sounds: Normal breath sounds. No wheezing or rhonchi.  Chest:     Chest wall: No tenderness.  Musculoskeletal:     Cervical back: Neck supple.  Lymphadenopathy:     Cervical: No cervical adenopathy.  Skin:    Coloration:  Skin is not jaundiced or pale.     Findings: No rash.  Neurological:     General: No focal deficit present.     Mental Status: She is alert and oriented to person, place, and time.  Psychiatric:        Behavior: Behavior normal.     UC Treatments / Results  Labs (all labs ordered are listed, but only abnormal results are displayed) Labs Reviewed - No data to display  EKG   Radiology DG Chest 2 View  Result Date: 02/15/2021 CLINICAL DATA:  precordial pain, atypical EXAM: CHEST - 2 VIEW COMPARISON:  None. FINDINGS: The heart size and mediastinal contours are within normal limits. Both lungs are clear. The visualized skeletal structures are unremarkable. IMPRESSION: No evidence of acute cardiopulmonary disease. Electronically Signed   By: Caprice Renshaw M.D.   On: 02/15/2021 14:11    Procedures Procedures (including critical care time)  Medications Ordered in UC Medications - No data to display  Initial Impression / Assessment and Plan / UC Course  I have reviewed the triage vital signs and the nursing notes.  Pertinent labs & imaging results that were available during my care of the patient were reviewed by me and considered in my medical decision making (see chart for details).     EKG shows no ST changes. Voltage normal, not low. T waves are a little flattened. NSR.  CXR clear.  BP mildly elevated here, not on meds. Did take them during pregnancy and right after.  Discussed establishing with a pcp  Final Clinical Impressions(s) / UC Diagnoses   Final diagnoses:  Precordial pain     Discharge Instructions      Take ibuprofen 800 mg 3 times daily as needed for pain. Warm compresses or a heating pad to the painful area.  Your blood pressure was a little elevated today. Please establish care with a primary doctor to recheck your blood pressure and see if you need treatment for that.  ED Prescriptions     Medication Sig Dispense Auth. Provider   ibuprofen  (ADVIL) 800 MG tablet Take 1 tablet (800 mg total) by mouth every 8 (eight) hours as needed (pain). 21 tablet Bennette Hasty, Janace Aris, MD      PDMP not reviewed this encounter.   Zenia Resides, MD 02/15/21 [phone]

## 2021-02-15 NOTE — ED Triage Notes (Signed)
Pt presents with mid chest pain x 3 days. States she took Tylenol and rubbed vapo rub, states it helped the first time. States the second time she had chest pain, Tylenol and vapo rub did not help.   States she has not been around anyone that is sick. States nothing else is making it better.  Denies fever and coughing.

## 2021-12-03 ENCOUNTER — Encounter (HOSPITAL_COMMUNITY): Payer: Self-pay | Admitting: *Deleted

## 2021-12-03 ENCOUNTER — Ambulatory Visit (HOSPITAL_COMMUNITY)
Admission: EM | Admit: 2021-12-03 | Discharge: 2021-12-03 | Disposition: A | Discharge status: Home / Self Care | Payer: Medicaid Other | Attending: Family Medicine | Admitting: Family Medicine

## 2021-12-03 DIAGNOSIS — N61 Mastitis without abscess: Secondary | ICD-10-CM | POA: Diagnosis not present

## 2021-12-03 MED ORDER — DOXYCYCLINE HYCLATE 100 MG PO CAPS
100.0000 mg | ORAL_CAPSULE | Freq: Two times a day (BID) | ORAL | 0 refills | Status: AC
Start: 2021-12-03 — End: 2021-12-10

## 2021-12-03 NOTE — Discharge Instructions (Addendum)
I think you have mastitis or infection in the tissues of your breast.  Take doxycycline 100 mg --1 capsule 2 times daily for 7 days  Warm compresses.    If you are feeling worse or the pain is intensifying then please return to be seen

## 2021-12-03 NOTE — ED Provider Notes (Signed)
[place]    CSN: [id number] Arrival date & time: 12/03/21  1731      History   Chief Complaint Chief Complaint  Patient presents with   Breast Discharge    HPI Colleen Shaw is a 26 y.o. female.   HPI Here for pain and swelling and redness of her left breast.  Started bothering her about 3 days ago she has noted a little discharge from her breast, but it is not constant.  No fever or chills.  No nausea or vomiting.  She has had a similar episode in November 2021 that resolved with doxycycline.  She did not have to have anything drained at that point.  Last menstrual cycle was September 13  Past Medical History:  Diagnosis Date   Group beta Strep positive    Pregnancy induced hypertension 2015    Patient Active Problem List   Diagnosis Date Noted   Miscarriage 08/03/2018   Normal labor 05/02/2016   Supervision of high risk pregnancy, antepartum 02/19/2016   Hx of preeclampsia, prior pregnancy, currently pregnant 02/19/2016   Late prenatal care affecting pregnancy, antepartum 02/19/2016   Vaginal bleeding before [redacted] weeks gestation 12/25/2015    Past Surgical History:  Procedure Laterality Date   NO PAST SURGERIES      OB History     Gravida  4   Para  3   Term  2   Preterm  1   AB      Living  3      SAB      IAB      Ectopic      Multiple  0   Live Births  3            Home Medications    Prior to Admission medications   Medication Sig Start Date End Date Taking? Authorizing Provider  doxycycline (VIBRAMYCIN) 100 MG capsule Take 1 capsule (100 mg total) by mouth 2 (two) times daily for 7 days. 12/03/21 12/10/21 Yes Odalys Win, Gwenlyn Perking, MD    Family History Family History  Problem Relation Age of Onset   Hypertension Mother    Asthma Father    Hypertension Paternal Grandmother     Social History Social History   Tobacco Use   Smoking status: Never   Smokeless tobacco: Never  Vaping Use   Vaping Use: Never  used  Substance Use Topics   Alcohol use: No   Drug use: No     Allergies   Food   Review of Systems Review of Systems   Physical Exam Triage Vital Signs ED Triage Vitals  Enc Vitals Group     BP 12/03/21 1821 (!) 145/98     Pulse Rate 12/03/21 1821 87     Resp 12/03/21 1821 18     Temp 12/03/21 1821 98.3 F (36.8 C)     Temp Source 12/03/21 1821 Oral     SpO2 12/03/21 1821 99 %     Weight --      Height --      Head Circumference --      Peak Flow --      Pain Score 12/03/21 1819 5     Pain Loc --      Pain Edu? --      Excl. in [place]? --    No data found.  Updated Vital Signs BP (!) 145/98 (BP Location: Right Arm)   Pulse 87   Temp 98.3 F (36.8 C) (Oral)  Resp 18   LMP 11/11/2021 (Approximate)   SpO2 99%   Visual Acuity Right Eye Distance:   Left Eye Distance:   Bilateral Distance:    Right Eye Near:   Left Eye Near:    Bilateral Near:     Physical Exam Vitals reviewed.  Constitutional:      General: She is not in acute distress.    Appearance: She is not ill-appearing, toxic-appearing or diaphoretic.  Cardiovascular:     Rate and Rhythm: Normal rate and regular rhythm.  Chest:       Comments: There is induration and mild erythema superior to the areola and in the upper portion of the areola; diameter is about 4 cm in diameter there is no fluctuance.  At present there is no nipple discharge Skin:    Coloration: Skin is not jaundiced or pale.  Neurological:     Mental Status: She is alert.      UC Treatments / Results  Labs (all labs ordered are listed, but only abnormal results are displayed) Labs Reviewed - No data to display  EKG   Radiology No results found.  Procedures Procedures (including critical care time)  Medications Ordered in UC Medications - No data to display  Initial Impression / Assessment and Plan / UC Course  I have reviewed the triage vital signs and the nursing notes.  Pertinent labs & imaging results  that were available during my care of the patient were reviewed by me and considered in my medical decision making (see chart for details).     Since the doxycycline works so well last time I am going to prescribe that again.  She is using Tylenol with good effect.  Warm compresses if she worsens she is to return for further evaluation Final Clinical Impressions(s) / UC Diagnoses   Final diagnoses:  Mastitis     Discharge Instructions      I think you have mastitis or infection in the tissues of your breast.  Take doxycycline 100 mg --1 capsule 2 times daily for 7 days  Warm compresses.    If you are feeling worse or the pain is intensifying then please return to be seen     ED Prescriptions     Medication Sig Dispense Auth. Provider   doxycycline (VIBRAMYCIN) 100 MG capsule Take 1 capsule (100 mg total) by mouth 2 (two) times daily for 7 days. 14 capsule Windy Carina, Gwenlyn Perking, MD      PDMP not reviewed this encounter.   Barrett Henle, MD 12/03/21 [phone]

## 2021-12-03 NOTE — ED Triage Notes (Signed)
Pt states her left breast is swollen x 3 days and having some drainage that is yellow started today. She states that last year this happened as well she was put on an antibiotic.

## 2021-12-10 ENCOUNTER — Encounter: Payer: Self-pay | Admitting: Internal Medicine

## 2021-12-10 ENCOUNTER — Other Ambulatory Visit: Payer: Self-pay

## 2021-12-10 ENCOUNTER — Ambulatory Visit: Payer: Medicaid Other | Attending: Internal Medicine | Admitting: Internal Medicine

## 2021-12-10 VITALS — BP 138/98 | HR 71 | Temp 99.1°F | Ht 62.0 in | Wt 169.0 lb

## 2021-12-10 DIAGNOSIS — F320 Major depressive disorder, single episode, mild: Secondary | ICD-10-CM

## 2021-12-10 DIAGNOSIS — Z23 Encounter for immunization: Secondary | ICD-10-CM

## 2021-12-10 DIAGNOSIS — N644 Mastodynia: Secondary | ICD-10-CM

## 2021-12-10 DIAGNOSIS — I10 Essential (primary) hypertension: Secondary | ICD-10-CM | POA: Insufficient documentation

## 2021-12-10 DIAGNOSIS — F419 Anxiety disorder, unspecified: Secondary | ICD-10-CM

## 2021-12-10 DIAGNOSIS — Z7689 Persons encountering health services in other specified circumstances: Secondary | ICD-10-CM

## 2021-12-10 DIAGNOSIS — D649 Anemia, unspecified: Secondary | ICD-10-CM

## 2021-12-10 MED ORDER — AMLODIPINE BESYLATE 5 MG PO TABS
5.0000 mg | ORAL_TABLET | Freq: Every day | ORAL | 4 refills | Status: DC
Start: 2021-12-10 — End: 2022-10-13
  Filled 2021-12-10: qty 30, 30d supply, fill #0

## 2021-12-10 NOTE — Progress Notes (Addendum)
Patient ID: Colleen Shaw, female    DOB: 1995-05-30  MRN: [id number]  CC: New Patient (Initial Visit) and Establish Care   Subjective: Colleen Shaw is a 26 y.o. female who presents for new patient visit Her concerns today include:   No PCP in several yrs.   Hx of pregnancy induced HTN. BP elev noted on most encounters with the health system as reviewed in Epic.  Was on med post last pregnancy 07-14-15, stopped taking because it was causing her to fall asleep.  She does not recall the name of the medication that she was on. - Endorses intermittent HA, no LE edema or SOB Admits she does not limit salt in foods  C/o pain and swelling LT breast last wk for which she was seen in UC Noticed a little green/yellow leakage from nipple around the same time. Seen UC 12/03/2021, dx with mastitis; prescribed doxycycline but never picked up due to transportation and lack finances. Today she tells me that the symptoms have completely resolved including the pain and swelling.  She noticed discharge from the nipple only 1 time.  She denies any redness.  I note that her PHQ-9 and GAD-7 scores are positive.  She gives history of depression for which she was seeing a Veterinary surgeon in 2014-07-14 in [place].  She subsequently moved to [facility] and has not pursued counseling since then. -Thinks that contributed to her depression over the years it is losing her mother at the age of 66.  Her father subsequently died in 07/14/10.  The father of her 3 children has been incarcerated for the past 2 years.  She hardly gets time for herself as she cannot afford a babysitter.  She has 4 siblings but is not close with any of them.  She feels her issues are more depression than anxiety.  Feels that she would benefit from counseling.  HM: Due for Pap.  Agrees for flu shot today. Patient Active Problem List   Diagnosis Date Noted   Miscarriage 08/03/2018   Normal labor 05/02/2016   Supervision of high risk pregnancy, antepartum  02/19/2016   Hx of preeclampsia, prior pregnancy, currently pregnant 02/19/2016   Late prenatal care affecting pregnancy, antepartum 02/19/2016   Vaginal bleeding before [redacted] weeks gestation 12/25/2015     Current Outpatient Medications on File Prior to Visit  Medication Sig Dispense Refill   doxycycline (VIBRAMYCIN) 100 MG capsule Take 1 capsule (100 mg total) by mouth 2 (two) times daily for 7 days. (Patient not taking: Reported on 12/10/2021) 14 capsule 0   No current facility-administered medications on file prior to visit.    Allergies  Allergen Reactions   Food Hives, Swelling and Other (See Comments)    Pt is allergic to strawberries.   Reaction:  All over body swelling     Social History   Socioeconomic History   Marital status: Single    Spouse name: Not on file   Number of children: 3   Years of education: Not on file   Highest education level: 12th grade  Occupational History   Occupation: Care Giver  Tobacco Use   Smoking status: Never   Smokeless tobacco: Never  Vaping Use   Vaping Use: Never used  Substance and Sexual Activity   Alcohol use: Yes    Comment: speical occasions only   Drug use: Yes    Types: Marijuana    Comment: Quit 3 yrs ago   Sexual activity: Not Currently    Birth control/protection:  None  Other Topics Concern   Not on file  Social History Narrative   Not on file   Social Determinants of Health   Financial Resource Strain: Not on file  Food Insecurity: Not on file  Transportation Needs: Not on file  Physical Activity: Not on file  Stress: Not on file  Social Connections: Not on file  Intimate Partner Violence: Not on file    Family History  Problem Relation Age of Onset   Hypertension Mother    Asthma Father    Hypertension Paternal Grandmother     Past Surgical History:  Procedure Laterality Date   NO PAST SURGERIES      ROS: Review of Systems Negative except as stated above  PHYSICAL EXAM: BP (!) 138/98    Pulse 71   Temp 99.1 F (37.3 C) (Oral)   Ht 5\' 2"  (1.575 m)   Wt 169 lb (76.7 kg)   LMP 11/11/2021 (Approximate)   SpO2 100%   BMI 30.91 kg/m   Physical Exam  General appearance - alert, well appearing, young African-American female and in no distress Mental status - normal mood, behavior, speech, dress, motor activity, and thought processes Mouth - mucous membranes moist, pharynx normal without lesions Neck - supple, no significant adenopathy Chest - clear to auscultation, no wheezes, rales or rhonchi, symmetric air entry Heart - normal rate, regular rhythm, normal S1, S2, no murmurs, rubs, clicks or gallops Breasts -CMA  DeEtta Riki Sheer present for exam:  breasts appear normal except both nipples are inverted, no suspicious masses, no skin or nipple changes or axillary nodes Extremities - peripheral pulses normal, no pedal edema, no clubbing or cyanosis     12/10/2021    8:59 AM 04/28/2016    2:59 PM 02/25/2016   10:39 AM  Depression screen PHQ 2/9  Decreased Interest 1 1 1   Down, Depressed, Hopeless 2 1 1   PHQ - 2 Score 3 2 2   Altered sleeping 3 1 1   Tired, decreased energy 2 1 2   Change in appetite 1 1 1   Feeling bad or failure about yourself  1 0 1  Trouble concentrating 1 0 0  Moving slowly or fidgety/restless 0 0 0  Suicidal thoughts 0 0 0  PHQ-9 Score 11 5 7       12/10/2021    9:00 AM 04/28/2016    2:59 PM 02/25/2016   10:39 AM 02/19/2016    5:14 PM  GAD 7 : Generalized Anxiety Score  Nervous, Anxious, on Edge 1 1 1 3   Control/stop worrying 3 1 1 2   Worry too much - different things 3 2 1 2   Trouble relaxing 2 1 2 2   Restless 1 0 2 0  Easily annoyed or irritable 1 1 3 3   Afraid - awful might happen 1 0 0 0  Total GAD 7 Score 12 6 10 12           Latest Ref Rng & Units 05/08/2016   10:19 AM 05/02/2016    9:12 AM 04/26/2016    2:17 PM  CMP  Glucose 65 - 99 mg/dL 90  77  107   BUN 6 - 20 mg/dL 13  9  7    Creatinine 0.44 - 1.00 mg/dL 0.58  0.59  0.56    Sodium 135 - 145 mmol/L 136  135  137   Potassium 3.5 - 5.1 mmol/L 3.4  3.7  3.2   Chloride 101 - 111 mmol/L 103  106  105  CO2 22 - 32 mmol/L 24  21  23    Calcium 8.9 - 10.3 mg/dL 8.2  8.4  8.3   Total Protein 6.5 - 8.1 g/dL 7.2  6.7  6.9   Total Bilirubin 0.3 - 1.2 mg/dL 0.1  0.6  0.5   Alkaline Phos 38 - 126 U/L 157  250  232   AST 15 - 41 U/L 25  22  22    ALT 14 - 54 U/L 15  9  7     CBC    Component Value Date/Time   WBC 6.5 07/29/2018 0911   RBC 4.01 07/29/2018 0911   HGB 10.9 (L) 07/29/2018 0911   HGB 10.3 (L) 01/02/2014 0411   HCT 33.3 (L) 07/29/2018 0911   HCT 22.9 (L) 01/04/2014 0533   PLT 382 07/29/2018 0911   PLT 325 01/02/2014 1604   MCV 83.0 07/29/2018 0911   MCV 85 01/02/2014 0411   MCH 27.2 07/29/2018 0911   MCHC 32.7 07/29/2018 0911   RDW 14.2 07/29/2018 0911   RDW 13.6 01/02/2014 0411   LYMPHSABS 2,353 02/19/2016 1058   MONOABS 1,810 (H) 02/19/2016 1058   EOSABS 362 02/19/2016 1058   BASOSABS 0 02/19/2016 1058    ASSESSMENT AND PLAN: 1. Establishing care with new doctor, encounter for  2. Essential hypertension DASH diet discussed and encouraged.  Printed information also provided.  Start amlodipine. - amLODipine (NORVASC) 5 MG tablet; Take 1 tablet (5 mg total) by mouth daily.  Dispense: 30 tablet; Refill: 4 - CBC - Comprehensive metabolic panel  3. Breast pain, left Issue has resolved.  Exam today is unrevealing.  Advised to let me know if she has any reoccurrence.  4. Major depressive disorder, single episode, mild (HCC) 5. Anxiety Patient denies any suicidal ideation at this time.  She does not want medication but feels she would benefit from counseling.  Message sent to our LCSW since patient is uninsured.  6. Need for immunization against influenza - Flu Vaccine QUAD 38mo+IM (Fluarix, Fluzone & Alfiuria Quad PF)  Addendum 12/12/2021: Patient with a mild stable anemia.  I will ask the lab to add iron studies.  Patient was given the  opportunity to ask questions.  Patient verbalized understanding of the plan and was able to repeat key elements of the plan.   This documentation was completed using 02/21/2016.  Any transcriptional errors are unintentional.  Orders Placed This Encounter  Procedures   Flu Vaccine QUAD 71mo+IM (Fluarix, Fluzone & Alfiuria Quad PF)   CBC   Comprehensive metabolic panel     Requested Prescriptions   Signed Prescriptions Disp Refills   amLODipine (NORVASC) 5 MG tablet 30 tablet 4    Sig: Take 1 tablet (5 mg total) by mouth daily.    Return in about 6 weeks (around 01/21/2022) for PAP.  Paediatric nurse, MD, FACP

## 2021-12-10 NOTE — Patient Instructions (Signed)
Your blood pressure is elevated.  Normal blood pressure is 120/80 or lower. Try to limit salt in the foods. We have started you on a blood pressure medication called amlodipine 5 mg daily.  DASH Eating Plan DASH stands for Dietary Approaches to Stop Hypertension. The DASH eating plan is a healthy eating plan that has been shown to: Reduce high blood pressure (hypertension). Reduce your risk for type 2 diabetes, heart disease, and stroke. Help with weight loss. What are tips for following this plan? Reading food labels Check food labels for the amount of salt (sodium) per serving. Choose foods with less than 5 percent of the Daily Value of sodium. Generally, foods with less than 300 milligrams (mg) of sodium per serving fit into this eating plan. To find whole grains, look for the word "whole" as the first word in the ingredient list. Shopping Buy products labeled as "low-sodium" or "no salt added." Buy fresh foods. Avoid canned foods and pre-made or frozen meals. Cooking Avoid adding salt when cooking. Use salt-free seasonings or herbs instead of table salt or sea salt. Check with your health care provider or pharmacist before using salt substitutes. Do not fry foods. Cook foods using healthy methods such as baking, boiling, grilling, roasting, and broiling instead. Cook with heart-healthy oils, such as olive, canola, avocado, soybean, or sunflower oil. Meal planning  Eat a balanced diet that includes: 4 or more servings of fruits and 4 or more servings of vegetables each day. Try to fill one-half of your plate with fruits and vegetables. 6-8 servings of whole grains each day. Less than 6 oz (170 g) of lean meat, poultry, or fish each day. A 3-oz (85-g) serving of meat is about the same size as a deck of cards. One egg equals 1 oz (28 g). 2-3 servings of low-fat dairy each day. One serving is 1 cup (237 mL). 1 serving of nuts, seeds, or beans 5 times each week. 2-3 servings of  heart-healthy fats. Healthy fats called omega-3 fatty acids are found in foods such as walnuts, flaxseeds, fortified milks, and eggs. These fats are also found in cold-water fish, such as sardines, salmon, and mackerel. Limit how much you eat of: Canned or prepackaged foods. Food that is high in trans fat, such as some fried foods. Food that is high in saturated fat, such as fatty meat. Desserts and other sweets, sugary drinks, and other foods with added sugar. Full-fat dairy products. Do not salt foods before eating. Do not eat more than 4 egg yolks a week. Try to eat at least 2 vegetarian meals a week. Eat more home-cooked food and less restaurant, buffet, and fast food. Lifestyle When eating at a restaurant, ask that your food be prepared with less salt or no salt, if possible. If you drink alcohol: Limit how much you use to: 0-1 drink a day for women who are not pregnant. 0-2 drinks a day for men. Be aware of how much alcohol is in your drink. In the [place] one drink equals one 12 oz bottle of beer (355 mL), one 5 oz glass of wine (148 mL), or one 1 oz glass of hard liquor (44 mL). General information Avoid eating more than 2,300 mg of salt a day. If you have hypertension, you may need to reduce your sodium intake to 1,500 mg a day. Work with your health care provider to maintain a healthy body weight or to lose weight. Ask what an ideal weight is for you. Get at  least 30 minutes of exercise that causes your heart to beat faster (aerobic exercise) most days of the week. Activities may include walking, swimming, or biking. Work with your health care provider or dietitian to adjust your eating plan to your individual calorie needs. What foods should I eat? Fruits All fresh, dried, or frozen fruit. Canned fruit in natural juice (without added sugar). Vegetables Fresh or frozen vegetables (raw, steamed, roasted, or grilled). Low-sodium or reduced-sodium tomato and vegetable juice.  Low-sodium or reduced-sodium tomato sauce and tomato paste. Low-sodium or reduced-sodium canned vegetables. Grains Whole-grain or whole-wheat bread. Whole-grain or whole-wheat pasta. Brown rice. [place]. Bulgur. Whole-grain and low-sodium cereals. Pita bread. Low-fat, low-sodium crackers. Whole-wheat flour tortillas. Meats and other proteins Skinless chicken or [place]. Ground chicken or [place]. Pork with fat trimmed off. Fish and seafood. Egg whites. Dried beans, peas, or lentils. Unsalted nuts, nut butters, and seeds. Unsalted canned beans. Lean cuts of beef with fat trimmed off. Low-sodium, lean precooked or cured meat, such as sausages or meat loaves. Dairy Low-fat (1%) or fat-free (skim) milk. Reduced-fat, low-fat, or fat-free cheeses. Nonfat, low-sodium ricotta or cottage cheese. Low-fat or nonfat yogurt. Low-fat, low-sodium cheese. Fats and oils Soft margarine without trans fats. Vegetable oil. Reduced-fat, low-fat, or light mayonnaise and salad dressings (reduced-sodium). Canola, safflower, olive, avocado, soybean, and sunflower oils. Avocado. Seasonings and condiments Herbs. Spices. Seasoning mixes without salt. Other foods Unsalted popcorn and pretzels. Fat-free sweets. The items listed above may not be a complete list of foods and beverages you can eat. Contact a dietitian for more information. What foods should I avoid? Fruits Canned fruit in a light or heavy syrup. Fried fruit. Fruit in cream or butter sauce. Vegetables Creamed or fried vegetables. Vegetables in a cheese sauce. Regular canned vegetables (not low-sodium or reduced-sodium). Regular canned tomato sauce and paste (not low-sodium or reduced-sodium). Regular tomato and vegetable juice (not low-sodium or reduced-sodium). Angie Fava. Olives. Grains Baked goods made with fat, such as croissants, muffins, or some breads. Dry pasta or rice meal packs. Meats and other proteins Fatty cuts of meat. Ribs. Fried meat. Berniece Salines.  Bologna, salami, and other precooked or cured meats, such as sausages or meat loaves. Fat from the back of a pig (fatback). Bratwurst. Salted nuts and seeds. Canned beans with added salt. Canned or smoked fish. Whole eggs or egg yolks. Chicken or [place] with skin. Dairy Whole or 2% milk, cream, and half-and-half. Whole or full-fat cream cheese. Whole-fat or sweetened yogurt. Full-fat cheese. Nondairy creamers. Whipped toppings. Processed cheese and cheese spreads. Fats and oils Butter. Stick margarine. Lard. Shortening. Ghee. Bacon fat. Tropical oils, such as coconut, palm kernel, or palm oil. Seasonings and condiments Onion salt, garlic salt, seasoned salt, table salt, and sea salt. Worcestershire sauce. Tartar sauce. Barbecue sauce. Teriyaki sauce. Soy sauce, including reduced-sodium. Steak sauce. Canned and packaged gravies. Fish sauce. Oyster sauce. Cocktail sauce. Store-bought horseradish. Ketchup. Mustard. Meat flavorings and tenderizers. Bouillon cubes. Hot sauces. Pre-made or packaged marinades. Pre-made or packaged taco seasonings. Relishes. Regular salad dressings. Other foods Salted popcorn and pretzels. The items listed above may not be a complete list of foods and beverages you should avoid. Contact a dietitian for more information. Where to find more information National Heart, Lung, and Blood Institute: https://wilson-eaton.com/ American Heart Association: www.heart.org Academy of Nutrition and Dietetics: www.eatright.[place]: www.kidney.org Summary The DASH eating plan is a healthy eating plan that has been shown to reduce high blood pressure (hypertension). It may also reduce your  risk for type 2 diabetes, heart disease, and stroke. When on the DASH eating plan, aim to eat more fresh fruits and vegetables, whole grains, lean proteins, low-fat dairy, and heart-healthy fats. With the DASH eating plan, you should limit salt (sodium) intake to 2,300 mg a day. If you have  hypertension, you may need to reduce your sodium intake to 1,500 mg a day. Work with your health care provider or dietitian to adjust your eating plan to your individual calorie needs. This information is not intended to replace advice given to you by your health care provider. Make sure you discuss any questions you have with your health care provider. Document Revised: 01/19/2019 Document Reviewed: 01/19/2019 Elsevier Patient Education  [place].

## 2021-12-11 ENCOUNTER — Telehealth: Payer: Self-pay | Admitting: Emergency Medicine

## 2021-12-11 LAB — COMPREHENSIVE METABOLIC PANEL
ALT: 9 IU/L (ref 0–32)
AST: 17 IU/L (ref 0–40)
Albumin/Globulin Ratio: 1.1 — ABNORMAL LOW (ref 1.2–2.2)
Albumin: 4.1 g/dL (ref 4.0–5.0)
Alkaline Phosphatase: 95 IU/L (ref 44–121)
BUN/Creatinine Ratio: 23 (ref 9–23)
BUN: 16 mg/dL (ref 6–20)
Bilirubin Total: <0.2 mg/dL (ref 0.0–1.2)
CO2: 23 mmol/L (ref 20–29)
Calcium: 9.7 mg/dL (ref 8.7–10.2)
Chloride: 101 mmol/L (ref 96–106)
Creatinine, Ser: 0.69 mg/dL (ref 0.57–1.00)
Globulin, Total: 3.7 g/dL (ref 1.5–4.5)
Glucose: 87 mg/dL (ref 70–99)
Potassium: 4.5 mmol/L (ref 3.5–5.2)
Sodium: 139 mmol/L (ref 134–144)
Total Protein: 7.8 g/dL (ref 6.0–8.5)
eGFR: 121 mL/min/{1.73_m2} (ref >59)

## 2021-12-11 LAB — CBC
Hematocrit: 35.8 % (ref 34.0–46.6)
Hemoglobin: 10.8 g/dL — ABNORMAL LOW (ref 11.1–15.9)
MCH: 24.9 pg — ABNORMAL LOW (ref 26.6–33.0)
MCHC: 30.2 g/dL — ABNORMAL LOW (ref 31.5–35.7)
MCV: 83 fL (ref 79–97)
Platelets: 513 10*3/uL — ABNORMAL HIGH (ref 150–450)
RBC: 4.34 x10E6/uL (ref 3.77–5.28)
RDW: 14.2 % (ref 11.7–15.4)
WBC: 7.2 10*3/uL (ref 3.4–10.8)

## 2021-12-11 NOTE — Telephone Encounter (Signed)
Pt called and informed that PCP has not viewed results yet and she can log onto her mychart tomorrow to see what the PCP says about her lab work.

## 2021-12-11 NOTE — Telephone Encounter (Signed)
Copied from [place] [id number]. Topic: General - Other >> Dec 11, 2021 11:23 AM Chapman Fitch wrote: Reason for CRM: pt would like a call to explain her lab results / please advise

## 2021-12-12 ENCOUNTER — Telehealth: Payer: Self-pay | Admitting: Internal Medicine

## 2021-12-12 NOTE — Addendum Note (Signed)
Addended by: Karle Plumber B on: 12/12/2021 07:42 AM   Modules accepted: Orders

## 2021-12-23 NOTE — Telephone Encounter (Signed)
LCSWA briefly spoke with patient today. Patient said she would call back she was not able to talk at the moment.

## 2021-12-24 LAB — SPECIMEN STATUS REPORT

## 2022-01-28 ENCOUNTER — Ambulatory Visit: Payer: Medicaid Other | Admitting: Internal Medicine

## 2022-04-05 ENCOUNTER — Ambulatory Visit: Payer: Medicaid Other | Admitting: Internal Medicine

## 2022-10-13 ENCOUNTER — Ambulatory Visit (HOSPITAL_COMMUNITY)
Admission: EM | Admit: 2022-10-13 | Discharge: 2022-10-13 | Disposition: A | Discharge status: Home / Self Care | Payer: Medicaid Other | Attending: Physician Assistant | Admitting: Physician Assistant

## 2022-10-13 ENCOUNTER — Encounter (HOSPITAL_COMMUNITY): Payer: Self-pay

## 2022-10-13 DIAGNOSIS — J029 Acute pharyngitis, unspecified: Secondary | ICD-10-CM

## 2022-10-13 DIAGNOSIS — B349 Viral infection, unspecified: Secondary | ICD-10-CM

## 2022-10-13 DIAGNOSIS — I10 Essential (primary) hypertension: Secondary | ICD-10-CM

## 2022-10-13 LAB — POCT RAPID STREP A (OFFICE): Rapid Strep A Screen: NEGATIVE

## 2022-10-13 MED ORDER — IBUPROFEN 800 MG PO TABS
ORAL_TABLET | ORAL | Status: AC
  Filled 2022-10-13: qty 1

## 2022-10-13 MED ORDER — NYSTATIN 100000 UNIT/ML MT SUSP: 5.0000 mL | Freq: Three times a day (TID) | OROMUCOSAL | 0 refills | Status: DC | PRN

## 2022-10-13 MED ORDER — AMLODIPINE BESYLATE 5 MG PO TABS: 5.0000 mg | ORAL_TABLET | Freq: Every day | ORAL | 0 refills | Status: DC

## 2022-10-13 MED ORDER — IBUPROFEN 800 MG PO TABS
800.0000 mg | ORAL_TABLET | Freq: Once | ORAL | Status: AC
  Administered 2022-10-13: 800 mg via ORAL

## 2022-10-13 NOTE — ED Triage Notes (Addendum)
Pt presents with sore in her mouth x 3-4 days. Pt also endorses feeling like she has a fever. Denies other symptoms. Pt requesting refill on bp meds.

## 2022-10-13 NOTE — ED Provider Notes (Signed)
[facility]    CSN: [id number] Arrival date & time: 10/13/22  1734      History   Chief Complaint Chief Complaint  Patient presents with   Mouth Lesions    HPI Colleen Shaw is a 27 y.o. female.   Patient presents today with a 3 to 4-day history of severe sore throat.  She reports pain is rated 10 on a 0-10 pain scale, described as burning/sharp, worse with swallowing, no alleviating factors identified.  She does report chills and subjective fever but has not measured her temperature.  She has had intermittent rhinorrhea but denies any significant cough, congestion, nausea, vomiting, diarrhea.  She is confident that she is not pregnant.  Denies any recent antibiotics.  Denies any known sick contacts that she does have young children.  She has taken ibuprofen when her symptoms first began but this was ineffective and so has not been taking any medication on a regular basis.  She is also requesting a refill of her blood pressure medication.  She is currently prescribed amlodipine 5 mg.  She denies any chest pain, shortness of breath, visual disturbance, dizziness.  Occasionally has headaches but denies any current symptoms.  She does try to eat a healthy diet and exercise.    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   Anxiety 12/10/2021   Miscarriage 08/03/2018   Normal labor 05/02/2016   Supervision of high risk pregnancy, antepartum 02/19/2016   Hx of preeclampsia, prior pregnancy, currently pregnant 02/19/2016   Late prenatal care affecting pregnancy, antepartum 02/19/2016   Vaginal bleeding before [redacted] weeks gestation 12/25/2015    Past Surgical History:  Procedure Laterality Date   NO PAST SURGERIES      OB  History     Gravida  4   Para  3   Term  2   Preterm  1   AB      Living  3      SAB      IAB      Ectopic      Multiple  0   Live Births  3            Home Medications    Prior to Admission medications   Medication Sig Start Date End Date Taking? Authorizing Provider  magic mouthwash (nystatin, lidocaine, diphenhydrAMINE) suspension Take 5 mLs by mouth 3 (three) times daily as needed for mouth pain. 10/13/22  Yes Rose Hegner K, PA-C  amLODipine (NORVASC) 5 MG tablet Take 1 tablet (5 mg total) by mouth daily. 10/13/22   Susan Arana, Noberto Retort, PA-C    Family History Family History  Problem Relation Age of Onset   Hypertension Mother    Asthma Father    Hypertension Paternal Grandmother     Social History Social History   Tobacco Use   Smoking status: Never   Smokeless tobacco: Never  Vaping Use   Vaping status: Never Used  Substance Use Topics   Alcohol use: Yes    Comment: speical occasions only   Drug use: Yes    Types: Marijuana    Comment: Quit 3 yrs ago     Allergies   Food   Review of Systems Review of Systems  Constitutional:  Positive for activity change, chills and fever (subjective). Negative for appetite change and fatigue.  HENT:  Positive for rhinorrhea and sore throat. Negative for congestion, postnasal drip, sinus pressure and sneezing.   Eyes:  Negative for visual disturbance.  Respiratory:  Negative for cough and shortness of breath.   Cardiovascular:  Negative for chest pain.  Gastrointestinal:  Negative for abdominal pain, diarrhea, nausea and vomiting.  Neurological:  Positive for headaches (intermittent). Negative for dizziness and light-headedness.     Physical Exam Triage Vital Signs ED Triage Vitals  Encounter Vitals Group     BP 10/13/22 1832 (!) 137/96     Systolic BP Percentile --      Diastolic BP Percentile --      Pulse Rate 10/13/22 1832 95     Resp 10/13/22 1832 18     Temp 10/13/22 1832 99.2 F (37.3 C)      Temp src --      SpO2 10/13/22 1832 97 %     Weight --      Height --      Head Circumference --      Peak Flow --      Pain Score 10/13/22 1831 10     Pain Loc --      Pain Education --      Exclude from Growth Chart --    No data found.  Updated Vital Signs BP (!) 137/96   Pulse 95   Temp 99.2 F (37.3 C)   Resp 18   LMP 09/30/2022   SpO2 97%   Visual Acuity Right Eye Distance:   Left Eye Distance:   Bilateral Distance:    Right Eye Near:   Left Eye Near:    Bilateral Near:     Physical Exam Vitals reviewed.  Constitutional:      General: She is awake. She is not in acute distress.    Appearance: Normal appearance. She is well-developed. She is not ill-appearing.     Comments: Appears stated in no acute distress sitting comfortably in exam room  HENT:     Head: Normocephalic and atraumatic.     Right Ear: Tympanic membrane, ear canal and external ear normal. Tympanic membrane is not erythematous or bulging.     Left Ear: External ear normal. There is impacted cerumen.     Nose:     Right Sinus: No maxillary sinus tenderness or frontal sinus tenderness.     Left Sinus: No maxillary sinus tenderness or frontal sinus tenderness.     Mouth/Throat:     Pharynx: Uvula midline. Posterior oropharyngeal erythema present. No oropharyngeal exudate.     Comments: Patient had difficulty opening her mouth and resisted using tongue depressor.  Able to see some scattered erythema soft palate but unable to visualize tonsils. Cardiovascular:     Rate and Rhythm: Normal rate and regular rhythm.     Heart sounds: Normal heart sounds, S1 normal and S2 normal. No murmur heard. Pulmonary:     Effort: Pulmonary effort is normal.     Breath sounds: Normal breath sounds. No wheezing, rhonchi or rales.     Comments: Clear to auscultation bilaterally Lymphadenopathy:     Head:     Right side of head: No submental, submandibular or tonsillar adenopathy.     Left side of head: No  submental, submandibular or tonsillar adenopathy.     Cervical: No cervical adenopathy.  Psychiatric:        Behavior:  Behavior is cooperative.      UC Treatments / Results  Labs (all labs ordered are listed, but only abnormal results are displayed) Labs Reviewed  CULTURE, GROUP A STREP [facility])  POCT RAPID STREP A (OFFICE)    EKG   Radiology No results found.  Procedures Procedures (including critical care time)  Medications Ordered in UC Medications  ibuprofen (ADVIL) tablet 800 mg (800 mg Oral Given 10/13/22 1846)    Initial Impression / Assessment and Plan / UC Course  I have reviewed the triage vital signs and the nursing notes.  Pertinent labs & imaging results that were available during my care of the patient were reviewed by me and considered in my medical decision making (see chart for details).     Patient is well-appearing, afebrile, nontoxic, nontachycardic.  She difficulty tolerating exam so was given ibuprofen 800 with some improvement of symptoms.  We were able to get a strep swab that was negative.  Will send this for culture but defer antibiotics until culture results are available.  Offered COVID testing but she declined this as she difficulty tolerating strep testing.  We discussed likely viral etiology.  She was given Magic mouthwash to help manage her symptoms with instruction to swish and spit those 3 times a day.  If she has any worsening or changing symptoms she is to be seen immediately including swelling of her throat, shortness of breath, muffled voice.  Recommend close follow-up with her primary care.  Strict return precautions given.  Work excuse note provided.  Her blood pressure was elevated.  Refill of her blood pressure medication was sent to pharmacy.  Recommended she monitor this at home and if this is persistently above 140/90 she needs to be seen and evaluated medication adjustment.  Discussed that if she develops any chest pain, shortness of  breath, headache, vision change, dizziness in setting of high blood pressure she needs to be seen immediately.  She is to follow-up with her primary care soon as possible.  Final Clinical Impressions(s) / UC Diagnoses   Final diagnoses:  Sore throat  Viral illness  Elevated blood pressure reading in office with diagnosis of hypertension     Discharge Instructions      Your strep testing was negative.  We will contact you if your throat culture is positive awaiting to start antibiotics.  Use Magic mouthwash 3 times a day.  Swish and spit this out.  You can use Tylenol and ibuprofen over-the-counter for additional pain relief.  If you have any worsening or changing symptoms please return for reevaluation including swelling of her throat, difficulty swallowing, muffled voice, fever, nausea, vomiting.  Follow-up with your primary care soon as possible.  Your blood pressure is elevated.  Please start amlodipine as previously prescribed.  Monitor your blood pressure at home.  If this is persistently above 140/90 please return so we can adjust her medication.  If you develop any chest pain, shortness of breath, headache, vision change, dizziness in the setting of high blood pressure you need to be seen immediately.     ED Prescriptions     Medication Sig Dispense Auth. Provider   amLODipine (NORVASC) 5 MG tablet Take 1 tablet (5 mg total) by mouth daily. 90 tablet Ajah Vanhoose K, PA-C   magic mouthwash (nystatin, lidocaine, diphenhydrAMINE) suspension Take 5 mLs by mouth 3 (three) times daily as needed for mouth pain. 180 mL Truong Delcastillo K, PA-C      PDMP not reviewed this  encounter.   Jeani Hawking, PA-C 10/13/22 1926

## 2022-10-13 NOTE — Discharge Instructions (Signed)
Your strep testing was negative.  We will contact you if your throat culture is positive awaiting to start antibiotics.  Use Magic mouthwash 3 times a day.  Swish and spit this out.  You can use Tylenol and ibuprofen over-the-counter for additional pain relief.  If you have any worsening or changing symptoms please return for reevaluation including swelling of her throat, difficulty swallowing, muffled voice, fever, nausea, vomiting.  Follow-up with your primary care soon as possible.  Your blood pressure is elevated.  Please start amlodipine as previously prescribed.  Monitor your blood pressure at home.  If this is persistently above 140/90 please return so we can adjust her medication.  If you develop any chest pain, shortness of breath, headache, vision change, dizziness in the setting of high blood pressure you need to be seen immediately.

## 2022-10-14 ENCOUNTER — Telehealth: Payer: Self-pay

## 2022-10-14 ENCOUNTER — Telehealth (HOSPITAL_COMMUNITY): Payer: Self-pay | Admitting: Emergency Medicine

## 2022-10-14 DIAGNOSIS — I10 Essential (primary) hypertension: Secondary | ICD-10-CM

## 2022-10-14 MED ORDER — AMLODIPINE BESYLATE 5 MG PO TABS: 5.0000 mg | ORAL_TABLET | Freq: Every day | ORAL | 0 refills | Status: DC

## 2022-10-14 MED ORDER — NYSTATIN 100000 UNIT/ML MT SUSP: 5.0000 mL | Freq: Three times a day (TID) | OROMUCOSAL | 0 refills | Status: DC | PRN

## 2022-10-14 NOTE — Telephone Encounter (Signed)
PAtient states pharmacy did not get mouthwash.  Called, and pharmacy states they did not receive prescription(receipt confirmed on prescription) Resending

## 2022-10-14 NOTE — Telephone Encounter (Signed)
Patient called requesting fever medication.  Reviewed with Denny Peon, APP, who okay'd Ibuprofen prescription.  However, when I called patient to update her she states she has already been to the pharmacy and is not interested at this time.

## 2022-10-16 LAB — CULTURE, GROUP A STREP (THRC)

## 2022-10-28 ENCOUNTER — Ambulatory Visit: Payer: Medicaid Other | Admitting: Pharmacist

## 2022-11-03 ENCOUNTER — Ambulatory Visit: Payer: Medicaid Other | Admitting: Pharmacist

## 2022-11-04 ENCOUNTER — Ambulatory Visit: Payer: Medicaid Other | Admitting: Pharmacist

## 2022-11-04 NOTE — Progress Notes (Unsigned)
   S:     No chief complaint on file.  27 y.o. female who presents for hypertension evaluation, education, and management. PMH is significant for hypertension, history of preeclampsia, and anxiety. Patient was identified as a failed TNM quality measure for hypertension.   Patient was last seen by Primary Care Provider, Dr. Laural Benes, on 12/10/2021 and BP at that visit was 138/98 mmHg. Hypertension was diagnosed then and pt was started on amlodipine 5 mg daily. Amlodipine Rx last filled on 10/13/2022   Today, patient arrives in *** spirits and presents without *** assistance. *** Denies dizziness, headache, blurred vision, swelling.   Family/Social history:  - Family History: hypertension (mother and paternal grandmother)  - Smoking Status: never - Alcohol use:   Medication adherence *** . Patient has *** taken BP medications today.   Current antihypertensives include: amlodipine   Antihypertensives tried in the past include: amlodipine, hydrochlorothiazide  Reported home BP readings: ***  Patient reported dietary habits:  -Sodium:  -Caffeine:   Patient-reported exercise habits: ***  ASCVD risk factors include: high BMI, hypertension   O:  ROS  Physical Exam  Last 3 Office BP readings: BP Readings from Last 3 Encounters:  10/13/22 (!) 137/96  12/10/21 (!) 138/98  12/03/21 (!) 145/98    BMET    Component Value Date/Time   NA 139 12/10/2021 1001   NA 141 01/02/2014 0411   K 4.5 12/10/2021 1001   K 3.2 (L) 01/02/2014 0411   CL 101 12/10/2021 1001   CL 106 01/02/2014 0411   CO2 23 12/10/2021 1001   CO2 27 01/02/2014 0411   GLUCOSE 87 12/10/2021 1001   GLUCOSE 90 05/08/2016 1019   GLUCOSE 79 01/02/2014 0411   BUN 16 12/10/2021 1001   BUN 6 (L) 01/02/2014 0411   CREATININE 0.69 12/10/2021 1001   CREATININE 0.61 01/02/2014 0411   CALCIUM 9.7 12/10/2021 1001   CALCIUM 8.5 01/02/2014 0411   GFRNONAA >60 05/08/2016 1019   GFRNONAA >60 01/02/2014 0411   GFRNONAA >60  08/19/2013 1622   GFRAA >60 05/08/2016 1019   GFRAA >60 01/02/2014 0411   GFRAA >60 08/19/2013 1622    Renal function: CrCl cannot be calculated (Patient's most recent lab result is older than the maximum 21 days allowed.).  Clinical ASCVD: No  The ASCVD Risk score (Arnett DK, et al., 2019) failed to calculate for the following reasons:   The 2019 ASCVD risk score is only valid for ages 13 to 39  Patient is participating in a Managed Medicaid Plan:  Yes    A/P: Hypertension diagnosed *** currently *** on current medications. BP goal < 130/80 *** mmHg. Medication adherence appears ***. Control is suboptimal due to ***.  -{Meds adjust:18428} ***.  -{Meds adjust:18428} ***.  -Patient educated on purpose, proper use, and potential adverse effects of ***.  -F/u labs ordered - *** -Counseled on lifestyle modifications for blood pressure control including reduced dietary sodium, increased exercise, adequate sleep. -Encouraged patient to check BP at home and bring log of readings to next visit. Counseled on proper use of home BP cuff.   Results reviewed and written information provided.    Written patient instructions provided. Patient verbalized understanding of treatment plan.  Total time in face to face counseling *** minutes.    Follow-up:  Pharmacist ***. PCP clinic visit in ***.  Patient seen with ***

## 2022-12-14 ENCOUNTER — Ambulatory Visit: Payer: Self-pay

## 2022-12-14 ENCOUNTER — Telehealth: Payer: Self-pay

## 2022-12-14 NOTE — Telephone Encounter (Signed)
  Chief Complaint: anxiety  Symptoms: anxious depressed overwhelmed, no energy, trouble sleeping  Frequency: ongoing years  Pertinent Negatives: Patient denies SI  Disposition: [] ED /[] Urgent Care (no appt availability in office) / [] Appointment(In office/virtual)/ []  [place] [facility]/ [] Home Care/ [] Refused Recommended Disposition /[x] [place] [facility]/ []  Follow-up with PCP Additional Notes: pt states she is feeling overwhelmed and wanting help, trouble sleeping and has to take medication or BP meds to sleep at night. Pt has seen several people have strokes d/t being on HTN meds and pt feels she is just over thinking too much. Pt hasn't been seen for anxiety or depression. No appts until 12/30/22, scheduled VV with [facility] tomorrow at 0900.   Reason for Disposition  [1] Anxiety symptoms AND [2] has not been evaluated for this by doctor (or NP/PA)  Answer Assessment - Initial Assessment Questions 1. CONCERN: "Did anything happen that prompted you to call today?"      Increased anxiety  2. ANXIETY SYMPTOMS: "Can you describe how you (your loved one; patient) have been feeling?" (e.g., tense, restless, panicky, anxious, keyed up, overwhelmed, sense of impending doom).      Depressed sad, down anxious, overwhelmed  3. ONSET: "How long have you been feeling this way?" (e.g., hours, days, weeks)     Years  4. SEVERITY: "How would you rate the level of anxiety?" (e.g., 0 - 10; or mild, moderate, severe).     Moderate  5. FUNCTIONAL IMPAIRMENT: "How have these feelings affected your ability to do daily activities?" "Have you had more difficulty than usual doing your normal daily activities?" (e.g., getting better, same, worse; self-care, school, work, interactions)     Yes not getting better  7. RISK OF HARM - SUICIDAL IDEATION: "Do you ever have thoughts of hurting or killing yourself?" If Yes, ask:  "Do you have these feelings now?" "Do you have a plan on how you would do this?"     no 8.  TREATMENT:  "What has been done so far to treat this anxiety?" (e.g., medicines, relaxation strategies). "What has helped?"     Nothing  9. TREATMENT - THERAPIST: "Do you have a counselor or therapist? Name?"     No requesting  11. PATIENT SUPPORT: "Who is with you now?" "Who do you live with?" "Do you have family or friends who you can talk to?"        Don't have a lot of support d/t parents passed away  63. OTHER SYMPTOMS: "Do you have any other symptoms?" (e.g., feeling depressed, trouble concentrating, trouble sleeping, trouble breathing, palpitations or fast heartbeat, chest pain, sweating, nausea, or diarrhea)       Trouble sleeping, having energy  Protocols used: Anxiety and Panic Attack-A-AH

## 2022-12-14 NOTE — Telephone Encounter (Signed)
Copied from CRM [id number]. Topic: Referral - Request for Referral >> Dec 14, 2022  3:19 PM Marlow Baars wrote: Has patient seen PCP for this complaint? Yes it was mentioned briefly in October New pt visit last year Referral for which specialty: Whatever provider or specialty that her primary care provider feels is best concerning elevated anxiety Preferred provider/office: Whoever accepts who insurance that her provider recommends Reason for referral:  Elevated anxiety  The patient states she has really bad anxiety and this is why she wants to see a therapist. The patient would have come in to see her provider sooner but she has been having difficulty with transportation Please assist patient further

## 2022-12-14 NOTE — Telephone Encounter (Signed)
noted 

## 2022-12-15 ENCOUNTER — Ambulatory Visit: Payer: Self-pay

## 2022-12-15 ENCOUNTER — Encounter: Payer: Medicaid Other | Admitting: Physician Assistant

## 2022-12-15 ENCOUNTER — Telehealth: Payer: Self-pay | Admitting: Internal Medicine

## 2022-12-15 DIAGNOSIS — Z91199 Patient's noncompliance with other medical treatment and regimen due to unspecified reason: Secondary | ICD-10-CM

## 2022-12-15 NOTE — Telephone Encounter (Signed)
Noted. Concerns and request will be address at VV with PA Cari Mayers during VV on 12/15/22.

## 2022-12-15 NOTE — Telephone Encounter (Signed)
Pt has VV with PA Colleen Shaw this morning.

## 2022-12-15 NOTE — Progress Notes (Signed)
No show

## 2022-12-15 NOTE — Telephone Encounter (Signed)
Noted  

## 2022-12-15 NOTE — Telephone Encounter (Signed)
Chief Complaint: Depression, missed this morning appointment due to connection issues Symptoms: down, not sleeping well Frequency: N/A Pertinent Negatives: Patient denies other symptoms Disposition: [] ED /[] Urgent Care (no appt availability in office) / [] Appointment(In office/virtual)/ []  [place] [facility]/ [] Home Care/ [] Refused Recommended Disposition /[x] [place] [facility]/ []  Follow-up with PCP Additional Notes: Patient says she was not able to do the visit scheduled virtually this morning due to connection issues, waited x 20 minutes. Advised she received a call to reschedule for 1000, she says she saw a private number, but didn't answer because she doesn't answer private numbers. No new symptoms mentioned other than what was told to NT yesterday. Rescheduled for tomorrow at 0900 with Maurene Capes, PA at the [facility] virtual visit, no visits in office and patient asked for a virtual due to transportation issues.   Reason for Disposition  [1] Depression AND [2] worsening (e.g., sleeping poorly, less able to do activities of daily living)  Answer Assessment - Initial Assessment Questions 1. CONCERN: "What happened that made you call today?"     Missed appointment from this morning 2. DEPRESSION SYMPTOM SCREENING: "How are you feeling overall?" (e.g., decreased energy, increased sleeping or difficulty sleeping, difficulty concentrating, feelings of sadness, guilt, hopelessness, or worthlessness)     Down, difficulty sleeping 3. RISK OF HARM - SUICIDAL IDEATION:  "Do you ever have thoughts of hurting or killing yourself?"  (e.g., yes, no, no but preoccupation with thoughts about death)   - INTENT:  "Do you have thoughts of hurting or killing yourself right NOW?" (e.g., yes, no, N/A)   - PLAN: "Do you have a specific plan for how you would do this?" (e.g., gun, knife, overdose, no plan, N/A)     No 4. RISK OF HARM - HOMICIDAL IDEATION:  "Do you ever have thoughts of hurting or  killing someone else?"  (e.g., yes, no, no but preoccupation with thoughts about death)   - INTENT:  "Do you have thoughts of hurting or killing someone right NOW?" (e.g., yes, no, N/A)   - PLAN: "Do you have a specific plan for how you would do this?" (e.g., gun, knife, no plan, N/A)      No 5. SUPPORT: "Who is with you now?" "Who do you live with?" "Do you have family or friends who you can talk to?"      No one, live with children 7. THERAPIST: "Do you have a counselor or therapist? Name?"     No 9. ALCOHOL USE OR SUBSTANCE USE (DRUG USE): "Do you drink alcohol or use any illegal drugs?"     Social drinking 10. OTHER: "Do you have any other physical symptoms right now?" (e.g., fever)       No  Protocols used: Depression-A-AH

## 2022-12-15 NOTE — Telephone Encounter (Signed)
Contacted pt to ask her if she would like to reschedule he appt for 10 am due to the provider having connectivity issues. No contact, LVM.

## 2022-12-16 ENCOUNTER — Telehealth: Payer: Medicaid Other | Admitting: Physician Assistant

## 2022-12-21 ENCOUNTER — Ambulatory Visit: Payer: Self-pay

## 2022-12-21 NOTE — Telephone Encounter (Signed)
Spoke with patient . Patient scheduled for VV tomorrow. Patient decline SW visit when offered.

## 2022-12-21 NOTE — Telephone Encounter (Signed)
       Chief Complaint: Still having feelings of depression. Not sleeping. "This has been going on for years." Asking for a video visit in the morning. No availability. Spoke with Cassandra in the practice and she will call pt. Back. Symptoms: Above Frequency: Years Pertinent Negatives: Patient denies any thoughts of harming herself. Disposition: [] ED /[] Urgent Care (no appt availability in office) / [] Appointment(In office/virtual)/ []  [place] [facility]/ [] Home Care/ [] Refused Recommended Disposition /[] [place] [facility]/ [x]  Follow-up with PCP Additional Notes: Please advise pt.  Reason for Disposition  [1] Depression AND [2] worsening (e.g., sleeping poorly, less able to do activities of daily living)  Answer Assessment - Initial Assessment Questions 1. CONCERN: "What happened that made you call today?"     Depression 2. DEPRESSION SYMPTOM SCREENING: "How are you feeling overall?" (e.g., decreased energy, increased sleeping or difficulty sleeping, difficulty concentrating, feelings of sadness, guilt, hopelessness, or worthlessness)     Not sleeping 3. RISK OF HARM - SUICIDAL IDEATION:  "Do you ever have thoughts of hurting or killing yourself?"  (e.g., yes, no, no but preoccupation with thoughts about death)   - INTENT:  "Do you have thoughts of hurting or killing yourself right NOW?" (e.g., yes, no, N/A)   - PLAN: "Do you have a specific plan for how you would do this?" (e.g., gun, knife, overdose, no plan, N/A)     No 4. RISK OF HARM - HOMICIDAL IDEATION:  "Do you ever have thoughts of hurting or killing someone else?"  (e.g., yes, no, no but preoccupation with thoughts about death)   - INTENT:  "Do you have thoughts of hurting or killing someone right NOW?" (e.g., yes, no, N/A)   - PLAN: "Do you have a specific plan for how you would do this?" (e.g., gun, knife, no plan, N/A)      No 5. FUNCTIONAL IMPAIRMENT: "How have things been going for you overall? Have you had more  difficulty than usual doing your normal daily activities?"  (e.g., better, same, worse; self-care, school, work, interactions)     No 6. SUPPORT: "Who is with you now?" "Who do you live with?" "Do you have family or friends who you can talk to?"      Family 7. THERAPIST: "Do you have a counselor or therapist? Name?"     No 8. STRESSORS: "Has there been any new stress or recent changes in your life?"     No 9. ALCOHOL USE OR SUBSTANCE USE (DRUG USE): "Do you drink alcohol or use any illegal drugs?"     No 10. OTHER: "Do you have any other physical symptoms right now?" (e.g., fever)       No 11. PREGNANCY: "Is there any chance you are pregnant?" "When was your last menstrual period?"       No  Protocols used: Depression-A-AH

## 2022-12-22 ENCOUNTER — Encounter: Payer: Self-pay | Admitting: Family Medicine

## 2022-12-22 ENCOUNTER — Telehealth: Payer: Self-pay | Admitting: Licensed Clinical Social Worker

## 2022-12-22 ENCOUNTER — Telehealth (INDEPENDENT_AMBULATORY_CARE_PROVIDER_SITE_OTHER): Payer: Self-pay | Admitting: Internal Medicine

## 2022-12-22 ENCOUNTER — Telehealth: Payer: Medicaid Other | Admitting: Family Medicine

## 2022-12-22 ENCOUNTER — Telehealth: Payer: Self-pay | Admitting: Family Medicine

## 2022-12-22 DIAGNOSIS — F320 Major depressive disorder, single episode, mild: Secondary | ICD-10-CM

## 2022-12-22 NOTE — Telephone Encounter (Signed)
Received thank you!

## 2022-12-22 NOTE — Telephone Encounter (Signed)
LCSWA called patient today to introduce herself and to assess patients' mental health needs. Patient was referred by PCP for depression and life stressors. Appt was scheduled 11/18 @ 11 virtual.

## 2022-12-22 NOTE — Telephone Encounter (Signed)
Copied from CRM [id number]. Topic: Appointment Scheduling - Scheduling Inquiry for Clinic >> Dec 22, 2022  3:19 PM De Blanch wrote: Reason for CRM: Pt is calling and requesting an appointment with Reginia Naas, per instruction from Dr. Alvis Lemmings.  Please advise.

## 2022-12-22 NOTE — Progress Notes (Signed)
Virtual Visit via Video Note  I connected with Colleen Shaw, on 12/22/2022 at 8:11 AM by video enabled telemedicine device and verified that I am speaking with the correct person using two identifiers.   Consent: I discussed the limitations, risks, security and privacy concerns of performing an evaluation and management service by telemedicine and the availability of in person appointments. I also discussed with the patient that there may be a patient responsible charge related to this service. The patient expressed understanding and agreed to proceed.   Location of Patient: Home  Location of Provider: Clinic   Persons participating in Telemedicine visit: Glenisha Welker Dr. Alvis Lemmings     History of Present Illness: Colleen Shaw is a 27 y.o. year old female patient of Dr. Laural Benes with history of hypertension, depression seen for an acute visit.  Discussed the use of AI scribe software for clinical note transcription with the patient, who gave verbal consent to proceed.  She presents with feelings of sadness and depression. She reports a significant history of loss, including the death of her Dad and grandparents, and feels isolated from her siblings. As a single mother of three small children, she feels overwhelmed and unsupported. She also reports difficulty sleeping, attributing this to stress and anxiety. She has been taking over-the-counter sleep aids, but inconsistently. She is currently not on any medication for depression and is hesitant to start any, expressing a preference for non-pharmacological interventions.      Past Medical History:  Diagnosis Date   Group beta Strep positive    Pregnancy induced hypertension 2015   Allergies  Allergen Reactions   Food Hives, Swelling and Other (See Comments)    Pt is allergic to strawberries.   Reaction:  All over body swelling     Current Outpatient Medications on File Prior to Visit  Medication Sig Dispense Refill    amLODipine (NORVASC) 5 MG tablet Take 1 tablet (5 mg total) by mouth daily. 90 tablet 0   magic mouthwash (nystatin, lidocaine, diphenhydrAMINE) suspension Take 5 mLs by mouth 3 (three) times daily as needed for mouth pain. 180 mL 0   No current facility-administered medications on file prior to visit.    ROS: See HPI  Observations/Objective: Awake, alert, oriented x3 Not in acute distress Normal mood      Latest Ref Rng & Units 12/10/2021   10:01 AM 05/08/2016   10:19 AM 05/02/2016    9:12 AM  CMP  Glucose 70 - 99 mg/dL 87  90  77   BUN 6 - 20 mg/dL 16  13  9    Creatinine 0.57 - 1.00 mg/dL 1.30  8.65  7.84   Sodium 134 - 144 mmol/L 139  136  135   Potassium 3.5 - 5.2 mmol/L 4.5  3.4  3.7   Chloride 96 - 106 mmol/L 101  103  106   CO2 20 - 29 mmol/L 23  24  21    Calcium 8.7 - 10.2 mg/dL 9.7  8.2  8.4   Total Protein 6.0 - 8.5 g/dL 7.8  7.2  6.7   Total Bilirubin 0.0 - 1.2 mg/dL <6.9  0.1  0.6   Alkaline Phos 44 - 121 IU/L 95  157  250   AST 0 - 40 IU/L 17  25  22    ALT 0 - 32 IU/L 9  15  9      Lipid Panel  No results found for: "CHOL", "TRIG", "HDL", "CHOLHDL", "VLDL", "LDLCALC", "LDLDIRECT", "LABVLDL"  No  results found for: "HGBA1C"   Assessment and Plan:     Depression Chronic sadness, stress, and insomnia. Significant psychosocial stressors including loss of family members and lack of support system. No suicidal ideation. Patient prefers non-pharmacological management. -Refer to social worker, Reginia Naas, for psychotherapy. -Schedule follow-up visit with Dr. Laural Benes in one month to assess progress and discuss potential changes in management.  Insomnia Difficulty sleeping due to stress and anxiety. Over-the-counter sleep aid available but not regularly used. -Encourage consistent sleep hygiene practices. -Consider use of over-the-counter sleep aid as needed.        Follow Up Instructions: Follow-up with PCP in 1 month for chronic disease management.   I  discussed the assessment and treatment plan with the patient. The patient was provided an opportunity to ask questions and all were answered. The patient agreed with the plan and demonstrated an understanding of the instructions.   The patient was advised to call back or seek an in-person evaluation if the symptoms worsen or if the condition fails to improve as anticipated.     I provided 14 minutes total of Telehealth time during this encounter including median intraservice time, reviewing previous notes, investigations, ordering medications, medical decision making, coordinating care and patient verbalized understanding at the end of the visit.     Hoy Register, MD, FAAFP. [facility] [place], [place] [phone]   12/22/2022, 8:11 AM

## 2022-12-22 NOTE — Telephone Encounter (Signed)
Patient with depression and several stressors including losses needing psychotherapy.  Declines medication.

## 2022-12-22 NOTE — Telephone Encounter (Signed)
LCSWA called patient today to introduce herself and to assess patients' mental health needs. Patient was referred by PCP ([place])for depression and life stressors. Appt was scheduled 11/18 @ 11 virtual.

## 2022-12-22 NOTE — Patient Instructions (Signed)
Managing Depression, Adult Depression is a mental health condition that affects your thoughts, feelings, and actions. Being diagnosed with depression can bring you relief if you did not know why you have felt or behaved a certain way. It could also leave you feeling overwhelmed. Finding ways to manage your symptoms can help you feel more positive about your future. How to manage lifestyle changes Being depressed is difficult. Depression can increase the level of everyday stress. Stress can make depression symptoms worse. You may believe your symptoms cannot be managed or will never improve. However, there are many things you can try to help manage your symptoms. There is hope. Managing stress  Stress is your body's reaction to life changes and events, both good and bad. Stress can add to your feelings of depression. Learning to manage your stress can help lessen your feelings of depression. Try some of the following approaches to reducing your stress (stress reduction techniques): Listen to music that you enjoy and that inspires you. Try using a meditation app or take a meditation class. Develop a practice that helps you connect with your spiritual self. Walk in nature, pray, or go to a place of worship. Practice deep breathing. To do this, inhale slowly through your nose. Pause at the top of your inhale for a few seconds and then exhale slowly, letting yourself relax. Repeat this three or four times. Practice yoga to help relax and work your muscles. Choose a stress reduction technique that works for you. These techniques take time and practice to develop. Set aside 5-15 minutes a day to do them. Therapists can offer training in these techniques. Do these things to help manage stress: Keep a journal. Know your limits. Set healthy boundaries for yourself and others, such as saying "no" when you think something is too much. Pay attention to how you react to certain situations. You may not be able to  control everything, but you can change your reaction. Add humor to your life by watching funny movies or shows. Make time for activities that you enjoy and that relax you. Spend less time using electronics, especially at night before bed. The light from screens can make your brain think it is time to get up rather than go to bed.  Medicines Medicines, such as antidepressants, are often a part of treatment for depression. Talk with your pharmacist or health care provider about all the medicines, supplements, and herbal products that you take, their possible side effects, and what medicines and other products are safe to take together. Make sure to report any side effects you may have to your health care provider. Relationships Your health care provider may suggest family therapy, couples therapy, or individual therapy as part of your treatment. How to recognize changes Everyone responds differently to treatment for depression. As you recover from depression, you may start to: Have more interest in doing activities. Feel more hopeful. Have more energy. Eat a more regular amount of food. Have better mental focus. It is important to recognize if your depression is not getting better or is getting worse. The symptoms you had in the beginning may return, such as: Feeling tired. Eating too much or too little. Sleeping too much or too little. Feeling restless, agitated, or hopeless. Trouble focusing or making decisions. Having unexplained aches and pains. Feeling irritable, angry, or aggressive. If you or your family members notice these symptoms coming back, let your health care provider know right away. Follow these instructions at home: Activity Try to   get some form of exercise each day, such as walking. Try yoga, mindfulness, or other stress reduction techniques. Participate in group activities if you are able. Lifestyle Get enough sleep. Cut down on or stop using caffeine, tobacco,  alcohol, and any other harmful substances. Eat a healthy diet that includes plenty of vegetables, fruits, whole grains, low-fat dairy products, and lean protein. Limit foods that are high in solid fats, added sugar, or salt (sodium). General instructions Take over-the-counter and prescription medicines only as told by your health care provider. Keep all follow-up visits. It is important for your health care provider to check on your mood, behavior, and medicines. Your health care provider may need to make changes to your treatment. Where to find support Talking to others  Friends and family members can be sources of support and guidance. Talk to trusted friends or family members about your condition. Explain your symptoms and let them know that you are working with a health care provider to treat your depression. Tell friends and family how they can help. Finances Find mental health providers that fit with your financial situation. Talk with your health care provider if you are worried about access to food, housing, or medicine. Call your insurance company to learn about your co-pays and prescription plan. Where to find more information You can find support in your area from: Anxiety and Depression Association of America (ADAA): adaa.org Mental Health America: mentalhealthamerica.net National Alliance on Mental Illness: nami.org Contact a health care provider if: You stop taking your antidepressant medicines, and you have any of these symptoms: Nausea. Headache. Light-headedness. Chills and body aches. Not being able to sleep (insomnia). You or your friends and family think your depression is getting worse. Get help right away if: You have thoughts of hurting yourself or others. Get help right away if you feel like you may hurt yourself or others, or have thoughts about taking your own life. Go to your nearest emergency room or: Call 911. Call the National Suicide Prevention Lifeline at  1-800-273-8255 or 988. This is open 24 hours a day. Text the Crisis Text Line at 741741. This information is not intended to replace advice given to you by your health care provider. Make sure you discuss any questions you have with your health care provider. Document Revised: 06/23/2021 Document Reviewed: 06/23/2021 Elsevier Patient Education  2024 Elsevier Inc.  

## 2022-12-22 NOTE — Telephone Encounter (Signed)
Received appt scheduled.

## 2023-01-17 ENCOUNTER — Ambulatory Visit: Payer: Medicaid Other | Attending: Licensed Clinical Social Worker | Admitting: Licensed Clinical Social Worker

## 2023-01-17 DIAGNOSIS — F320 Major depressive disorder, single episode, mild: Secondary | ICD-10-CM

## 2023-01-26 ENCOUNTER — Telehealth: Payer: Self-pay | Admitting: Licensed Clinical Social Worker

## 2023-01-26 NOTE — BH Specialist Note (Signed)
Integrated Behavioral Health via Telemedicine Visit  01/17/2023 Colleen Shaw [id number]  Number of Integrated Behavioral Health Clinician visits: 1- Initial Visit  Session Start time: 1000   Session End time: 1058  Total time in minutes: 58   Referring Provider: PCP Patient/Family location: at home [facility] Provider location: [facility] All persons participating in visit: Pt and LCSWA Types of Service: Individual psychotherapy, Video visit, and Introduction only  I connected with Colleen Shaw  via  Telephone or Video Enabled Telemedicine Application  (Video is Caregility application) and verified that I am speaking with the correct person using two identifiers. Discussed confidentiality: Yes   I discussed the limitations of telemedicine and the availability of in person appointments.  Discussed there is a possibility of technology failure and discussed alternative modes of communication if that failure occurs.  I discussed that engaging in this telemedicine visit, they consent to the provision of behavioral healthcare and the services will be billed under their insurance.  Patient and/or legal guardian expressed understanding and consented to Telemedicine visit: Yes   Presenting Concerns: Patient and/or family reports the following symptoms/concerns: Not sleeping always feeling tired and sad symptoms of depression major life stressors financial problems Duration of problem: years; Severity of problem: moderate  Patient and/or Family's Strengths/Protective Factors: Concrete supports in place (healthy food, safe environments, etc.)  Goals Addressed: Patient will:  Reduce symptoms of: depression and stress   Increase knowledge and/or ability of: coping skills   Demonstrate ability to: Increase healthy adjustment to current life circumstances  Progress towards Goals: Ongoing  Interventions: Interventions utilized:  Supportive Counseling Standardized Assessments completed: Not  Needed  Patient and/or Family Response: Patient currently experiencing patient is experiencing insomnia throughout the night patient shares that her child's father is in jail her mom died when she was 58 years old and her dad was hit by a car when she was 76yrs.  She has 3 children 6 7 and 9.  She reports that she is depressed often.  Patient is concerned about her blood pressure often being elevated always feeling tired she is currently taking medications and changing her diet.  Patient is receptive to developing coping skills for medication management and life stressors. Limited family support  Assessment: Patient may benefit from ongoing support from [facility] and medication to help her sleep.  Plan: Follow up with behavioral health clinician on : 4 weeks Behavioral recommendations: sleep medication, therapy,  Acknowledge the depression verbally and solve its causes, leading to normalization of the emotional state, Develop healthy cognitive patterns and beliefs about self and the world that lead to alleviation and help prevent the relapse of depression symptoms   Referral(s): Integrated [employer] (In Clinic)  I discussed the assessment and treatment plan with the patient and/or parent/guardian. They were provided an opportunity to ask questions and all were answered. They agreed with the plan and demonstrated an understanding of the instructions.   They were advised to call back or seek an in-person evaluation if the symptoms worsen or if the condition fails to improve as anticipated.  Vassie Loll, LCSWA

## 2023-01-26 NOTE — Telephone Encounter (Signed)
Met with patient last week and a virtual visit patient shared that she had her appointment last week as well throughout the day and noted a request for sleeping medication to please follow up thank you

## 2023-02-03 ENCOUNTER — Ambulatory Visit: Payer: Self-pay | Admitting: *Deleted

## 2023-02-03 NOTE — Telephone Encounter (Signed)
Noted  

## 2023-02-03 NOTE — Telephone Encounter (Signed)
  Chief Complaint: Breast Pain Symptoms: Pain in right breast, nipple discharge both breasts "Yellow greenish" States knot felt in right breast. Frequency: 2 days Pertinent Negatives: Patient denies fever, no redness or warmth to breasts. "Right breast feels heavier. Disposition: [] ED /[x] Urgent Care (no appt availability in office) / [] Appointment(In office/virtual)/ []  [facility]/ [] Home Care/ [] Refused Recommended Disposition /[] [facility]/ []  Follow-up with PCP Additional Notes: Pt states she had similar symptoms 2 years ago and was placed on ATBs. No availability at practice, could not make one that is available tomorrow with [facility]. States will go to UC. Advised may send her to ED. Care advise provided, pt verbalizes understanding.  Reason for Disposition  [1] Breast looks infected (spreading redness, feels hot or painful to touch) AND [2] no fever  Answer Assessment - Initial Assessment Questions 1. SYMPTOM: "What's the main symptom you're concerned about?"  (e.g., lump, pain, rash, nipple discharge)     Nipple discharge both breasts, pain in right breast. 2. LOCATION: "Where is the  located?"     Right breast 3. ONSET: "When did   start?"     2 days 4. PRIOR HISTORY: "Do you have any history of prior problems with your breasts?" (e.g., lumps, cancer, fibrocystic breast disease)     Yes, same 2 years ago 5. CAUSE: "What do you think is causing this symptom?"     Unsure 6. OTHER SYMPTOMS: "Do you have any other symptoms?" (e.g., fever, breast pain, redness or rash, nipple discharge)     Right breast "Feels heavier." Knot in right breast.  Protocols used: Breast Symptoms-A-AH

## 2023-02-04 ENCOUNTER — Ambulatory Visit (HOSPITAL_COMMUNITY)
Admission: EM | Admit: 2023-02-04 | Discharge: 2023-02-04 | Disposition: A | Discharge status: Home / Self Care | Payer: Medicaid Other | Attending: Emergency Medicine | Admitting: Emergency Medicine

## 2023-02-04 ENCOUNTER — Other Ambulatory Visit: Payer: Self-pay | Admitting: Internal Medicine

## 2023-02-04 ENCOUNTER — Encounter (HOSPITAL_COMMUNITY): Payer: Self-pay | Admitting: *Deleted

## 2023-02-04 DIAGNOSIS — N644 Mastodynia: Secondary | ICD-10-CM | POA: Diagnosis not present

## 2023-02-04 DIAGNOSIS — N6310 Unspecified lump in the right breast, unspecified quadrant: Secondary | ICD-10-CM

## 2023-02-04 LAB — POCT URINE PREGNANCY: Preg Test, Ur: NEGATIVE

## 2023-02-04 MED ORDER — IBUPROFEN 800 MG PO TABS
800.0000 mg | ORAL_TABLET | Freq: Once | ORAL | Status: AC
  Administered 2023-02-04: 800 mg via ORAL

## 2023-02-04 MED ORDER — IBUPROFEN 800 MG PO TABS: 800.0000 mg | ORAL_TABLET | Freq: Three times a day (TID) | ORAL | 0 refills | Status: DC

## 2023-02-04 MED ORDER — IBUPROFEN 800 MG PO TABS
ORAL_TABLET | ORAL | Status: AC
  Filled 2023-02-04: qty 1

## 2023-02-04 MED ORDER — SULFAMETHOXAZOLE-TRIMETHOPRIM 800-160 MG PO TABS: 1.0000 | ORAL_TABLET | Freq: Two times a day (BID) | ORAL | 0 refills | Status: AC

## 2023-02-04 NOTE — Discharge Instructions (Addendum)
You can take 800 mg of ibuprofen 3 times daily with food for breast pain.  You can also do hot or cold compress.  I am covering you with antibiotics in case you have an infection in your right breast.  The breast center will be reaching out to contact you to schedule your imaging soon. Please follow-up with your primary care provider if your symptoms continue.

## 2023-02-04 NOTE — ED Provider Notes (Signed)
[facility]    CSN: [id number] Arrival date & time: 02/04/23  4098      History   Chief Complaint Chief Complaint  Patient presents with   Breast Pain    HPI Colleen Shaw is a 27 y.o. female.   Patient presents to clinic for bilateral breast pain.  She has had right-sided breast pain for the past week and a half.  This morning she woke up and started to have left-sided breast pain.  She has had some drainage from both nipples.  Both nipples are currently inverted, reports this happens when it gets cold outside.  Has not had any fevers.  No drainage or skin changes around the breast.  She does have a lump that she has felt in her right breast.  She took Tylenol for this.  Reports her menstrual cycle usually comes at the beginning of each month.  She has not been sexually active recently.  The history is provided by the patient and medical records.    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   Anxiety 12/10/2021   Miscarriage 08/03/2018   Normal labor 05/02/2016   Supervision of high risk pregnancy, antepartum 02/19/2016   Hx of preeclampsia, prior pregnancy, currently pregnant 02/19/2016   Late prenatal care affecting pregnancy, antepartum 02/19/2016   Vaginal bleeding before [redacted] weeks gestation 12/25/2015    Past Surgical History:  Procedure Laterality Date   NO PAST SURGERIES      OB History     Gravida  4   Para  3   Term  2   Preterm  1   AB      Living  3      SAB      IAB      Ectopic      Multiple  0   Live Births  3            Home Medications    Prior to Admission medications   Medication Sig Start Date End Date Taking? Authorizing Provider  amLODipine (NORVASC)  5 MG tablet Take 1 tablet (5 mg total) by mouth daily. 10/14/22  Yes Raspet, Erin K, PA-C  ibuprofen (ADVIL) 800 MG tablet Take 1 tablet (800 mg total) by mouth 3 (three) times daily. 02/04/23  Yes Rinaldo Ratel, [place] N, FNP  sulfamethoxazole-trimethoprim (BACTRIM DS) 800-160 MG tablet Take 1 tablet by mouth 2 (two) times daily for 7 days. 02/04/23 02/11/23 Yes Rinaldo Ratel, [place] N, FNP  magic mouthwash (nystatin, lidocaine, diphenhydrAMINE) suspension Take 5 mLs by mouth 3 (three) times daily as needed for mouth pain. 10/14/22   Lamptey, Britta Mccreedy, MD    Family History Family History  Problem Relation Age of Onset   Hypertension Mother    Asthma Father    Hypertension Paternal Grandmother     Social History Social History   Tobacco Use   Smoking status: Never   Smokeless tobacco: Never  Vaping Use   Vaping status: Never Used  Substance Use Topics   Alcohol use: Yes    Comment: speical occasions only   Drug use: Yes    Types: Marijuana    Comment: Quit 3 yrs ago     Allergies   Food  Review of Systems Review of Systems  Per HPI   Physical Exam Triage Vital Signs ED Triage Vitals  Encounter Vitals Group     BP 02/04/23 0939 131/79     Systolic BP Percentile --      Diastolic BP Percentile --      Pulse Rate 02/04/23 0939 93     Resp 02/04/23 0939 18     Temp 02/04/23 0939 98.2 F (36.8 C)     Temp Source 02/04/23 0939 Oral     SpO2 02/04/23 0939 99 %     Weight --      Height --      Head Circumference --      Peak Flow --      Pain Score 02/04/23 0938 7     Pain Loc --      Pain Education --      Exclude from Growth Chart --    No data found.  Updated Vital Signs BP 131/79 (BP Location: Right Arm)   Pulse 93   Temp 98.2 F (36.8 C) (Oral)   Resp 18   LMP 01/02/2023 (Approximate)   SpO2 99%   Visual Acuity Right Eye Distance:   Left Eye Distance:   Bilateral Distance:    Right Eye Near:   Left Eye Near:    Bilateral Near:     Physical  Exam Vitals and nursing note reviewed. Exam conducted with a chaperone present.  Constitutional:      Appearance: Normal appearance.  HENT:     Head: Normocephalic and atraumatic.     Right Ear: External ear normal.     Left Ear: External ear normal.     Nose: Nose normal.     Mouth/Throat:     Mouth: Mucous membranes are moist.  Eyes:     Conjunctiva/sclera: Conjunctivae normal.  Cardiovascular:     Rate and Rhythm: Normal rate.  Pulmonary:     Effort: Pulmonary effort is normal. No respiratory distress.  Chest:  Breasts:    Right: Swelling, inverted nipple, mass and tenderness present. No nipple discharge or skin change.     Left: Inverted nipple present. No nipple discharge or skin change.       Comments: Right breast is swollen when compared to left.  There is a large indurated tender area that is approximately 6cm x 5 cm at the 11 o clock position.   Bilateral nipple inversions.  Left breast with no masses felt and no tenderness.  Unable to express any discharge from the nipples at this time. Musculoskeletal:        General: Normal range of motion.  Skin:    General: Skin is warm and dry.  Neurological:     General: No focal deficit present.     Mental Status: She is alert.  Psychiatric:        Mood and Affect: Mood normal.        Behavior: Behavior is cooperative.      UC Treatments / Results  Labs (all labs ordered are listed, but only abnormal results are displayed) Labs Reviewed  POCT URINE PREGNANCY - Normal    EKG   Radiology No results found.  Procedures Procedures (including critical care time)  Medications Ordered in UC Medications  ibuprofen (ADVIL) tablet 800 mg (800 mg Oral Given 02/04/23 1006)    Initial Impression / Assessment and Plan / UC Course  I have reviewed the triage vital signs and the nursing notes.  Pertinent labs & imaging results that were available during my care of the patient were reviewed by me and considered in my  medical decision making (see chart for details).  Vitals and triage reviewed, patient is hemodynamically stable.  Right breast is swollen, large tender mass palpated on physical exam.  Ibuprofen given in clinic for inflammation.  Will send for breast ultrasound and placed on antibiotics for potential breast abscess/infection with Bactrim.  Bilateral nipple inversion.  No discharge expressed. Negative urine pregnancy.  Pain management discussed.  Plan of care, follow-up care return precautions given, no questions at this time.     Final Clinical Impressions(s) / UC Diagnoses   Final diagnoses:  Breast pain in female  Mass of right breast, unspecified quadrant     Discharge Instructions      You can take 800 mg of ibuprofen 3 times daily with food for breast pain.  You can also do hot or cold compress.  I am covering you with antibiotics in case you have an infection in your right breast.  The breast center will be reaching out to contact you to schedule your imaging soon. Please follow-up with your primary care provider if your symptoms continue.      ED Prescriptions     Medication Sig Dispense Auth. Provider   ibuprofen (ADVIL) 800 MG tablet Take 1 tablet (800 mg total) by mouth 3 (three) times daily. 21 tablet Rinaldo Ratel, [place] N, [place]   sulfamethoxazole-trimethoprim (BACTRIM DS) 800-160 MG tablet Take 1 tablet by mouth 2 (two) times daily for 7 days. 14 tablet Eliazer Hemphill, [place] N, [place]      PDMP not reviewed this encounter.   Ibraheem Voris, [place] N, [place] 02/04/23 1010

## 2023-02-04 NOTE — ED Triage Notes (Signed)
Pt states she has bilateral breast pain X 1.5 week. She is takign tylenol as needed.

## 2023-02-16 ENCOUNTER — Ambulatory Visit: Payer: Medicaid Other | Attending: Licensed Clinical Social Worker | Admitting: Licensed Clinical Social Worker

## 2023-02-20 ENCOUNTER — Encounter (HOSPITAL_COMMUNITY): Payer: Self-pay

## 2023-02-20 ENCOUNTER — Ambulatory Visit (HOSPITAL_COMMUNITY)
Admission: EM | Admit: 2023-02-20 | Discharge: 2023-02-20 | Disposition: A | Discharge status: Home / Self Care | Payer: Medicaid Other | Attending: Internal Medicine | Admitting: Internal Medicine

## 2023-02-20 DIAGNOSIS — N611 Abscess of the breast and nipple: Secondary | ICD-10-CM | POA: Diagnosis not present

## 2023-02-20 MED ORDER — DOXYCYCLINE HYCLATE 100 MG PO CAPS: 100.0000 mg | ORAL_CAPSULE | Freq: Two times a day (BID) | ORAL | 0 refills | Status: DC

## 2023-02-20 MED ORDER — KETOROLAC TROMETHAMINE 10 MG PO TABS: 10.0000 mg | ORAL_TABLET | Freq: Four times a day (QID) | ORAL | 0 refills | Status: DC | PRN

## 2023-02-20 NOTE — Discharge Instructions (Addendum)
Right breast infection. Need to get appointment with [facility] for drainage of the abscess. We will treat with the following:  Doxycycline 100 mg twice daily for 10 days Toradol 10mg  every 6 hours as needed for pain Call the Breast Center on Monday to schedule the appointment for Mammogram and ultrasound. Return to urgent care or PCP if symptoms worsen or fail to resolve.

## 2023-02-20 NOTE — ED Provider Notes (Signed)
[facility]    CSN: [id number] Arrival date & time: 02/20/23  1457      History   Chief Complaint Chief Complaint  Patient presents with   Breast Pain    HPI Colleen Shaw is a 27 y.o. female.   27 year old female who presents to urgent care with complaints of persistent right breast pain and swelling.  She was seen on December 6 secondary to this and was started on Bactrim and order was placed for ultrasound-guided drainage as well as mammogram.  The patient reports that right after this her phone was cut off and she did not receive any phone call to schedule this.  She continues to have persistent symptoms.  Overall they have not worsened significantly.  She did finish the antibiotics out.  She has had some low-grade fevers at times.  She denies nausea, vomiting, chills.     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   Anxiety 12/10/2021   Miscarriage 08/03/2018   Normal labor 05/02/2016   Supervision of high risk pregnancy, antepartum 02/19/2016   Hx of preeclampsia, prior pregnancy, currently pregnant 02/19/2016   Late prenatal care affecting pregnancy, antepartum 02/19/2016   Vaginal bleeding before [redacted] weeks gestation 12/25/2015    Past Surgical History:  Procedure Laterality Date   NO PAST SURGERIES      OB History     Gravida  4   Para  3   Term  2   Preterm  1   AB      Living  3      SAB      IAB      Ectopic      Multiple  0   Live Births  3            Home Medications    Prior to Admission medications   Medication Sig Start Date End Date Taking? Authorizing Provider  doxycycline (VIBRAMYCIN) 100 MG capsule Take 1 capsule (100 mg total) by mouth 2 (two) times daily.  02/20/23  Yes Stillman Buenger A, PA-C  ketorolac (TORADOL) 10 MG tablet Take 1 tablet (10 mg total) by mouth every 6 (six) hours as needed. 02/20/23  Yes Caralyn Twining A, PA-C  amLODipine (NORVASC) 5 MG tablet Take 1 tablet (5 mg total) by mouth daily. 10/14/22   Raspet, Noberto Retort, PA-C    Family History Family History  Problem Relation Age of Onset   Hypertension Mother    Asthma Father    Hypertension Paternal Grandmother     Social History Social History   Tobacco Use   Smoking status: Never   Smokeless tobacco: Never  Vaping Use   Vaping status: Never Used  Substance Use Topics   Alcohol use: Yes    Comment: speical occasions only   Drug use: Not Currently    Types: Marijuana    Comment: Quit 3 yrs ago     Allergies   Food   Review of Systems Review of Systems  Constitutional:  Negative for chills and fever.  HENT:  Negative for ear pain and sore throat.   Eyes:  Negative for pain and visual disturbance.  Respiratory:  Negative for cough and shortness of breath.   Cardiovascular:  Negative for chest pain and palpitations.  Gastrointestinal:  Negative for abdominal pain and vomiting.  Genitourinary:  Negative for dysuria and hematuria.  Musculoskeletal:  Negative for arthralgias and back pain.  Skin:  Negative for color change and rash.       Right breast pain and swelling with some redness  Neurological:  Negative for seizures and syncope.  All other systems reviewed and are negative.    Physical Exam Triage Vital Signs ED Triage Vitals  Encounter Vitals Group     BP 02/20/23 1550 (!) 125/91     Systolic BP Percentile --      Diastolic BP Percentile --      Pulse Rate 02/20/23 1550 97     Resp 02/20/23 1550 16     Temp 02/20/23 1550 99.4 F (37.4 C)     Temp Source 02/20/23 1550 Oral     SpO2 02/20/23 1550 97 %     Weight --      Height 02/20/23 1547 5\' 1"  (1.549 m)     Head Circumference --      Peak Flow --      Pain Score 02/20/23 1546 8      Pain Loc --      Pain Education --      Exclude from Growth Chart --    No data found.  Updated Vital Signs BP (!) 125/91 (BP Location: Right Arm)   Pulse 97   Temp 99.4 F (37.4 C) (Oral)   Resp 16   Ht 5\' 1"  (1.549 m)   LMP 02/01/2023 (Approximate)   SpO2 97%   BMI 31.93 kg/m   Visual Acuity Right Eye Distance:   Left Eye Distance:   Bilateral Distance:    Right Eye Near:   Left Eye Near:    Bilateral Near:     Physical Exam Vitals and nursing note reviewed.  Constitutional:      General: She is not in acute distress.    Appearance: She is well-developed.  HENT:     Head: Normocephalic and atraumatic.  Eyes:     Conjunctiva/sclera: Conjunctivae normal.  Cardiovascular:     Rate and Rhythm: Normal rate and regular rhythm.     Heart sounds: No murmur heard. Pulmonary:     Effort: Pulmonary effort is normal. No respiratory distress.     Breath sounds: Normal breath sounds.  Chest:       Comments: Right nipple is inverted, area just lateral to this is indurated with some mild erythema, this feels very deep in the breast. Abdominal:     Palpations: Abdomen is soft.     Tenderness: There is no abdominal tenderness.  Musculoskeletal:        General: No swelling.     Cervical back: Neck supple.  Skin:    General: Skin is warm and dry.     Capillary Refill: Capillary refill takes less than 2 seconds.  Neurological:     Mental Status: She is alert.  Psychiatric:        Mood and Affect: Mood normal.      UC Treatments / Results  Labs (all labs ordered are listed, but only abnormal results are displayed) Labs Reviewed - No data to display  EKG   Radiology No results found.  Procedures Procedures (including critical care time)  Medications Ordered in UC Medications - No data to display  Initial Impression / Assessment and Plan / UC Course  I have reviewed the triage  vital signs and the nursing notes.  Pertinent labs & imaging results that were  available during my care of the patient were reviewed by me and considered in my medical decision making (see chart for details).     Abscess of right breast   Persistent right breast infection.  The patient likely needs ultrasound-guided drainage.  Need to get appointment with [facility] for drainage of the abscess.  We will have the patient call the breast center on Monday and we will treat with the following:  Doxycycline 100 mg twice daily for 10 days Toradol 10mg  every 6 hours as needed for pain Call the Breast Center on Monday to schedule the appointment for Mammogram and ultrasound. Return to urgent care or PCP if symptoms worsen or fail to resolve.    Final Clinical Impressions(s) / UC Diagnoses   Final diagnoses:  Abscess of right breast     Discharge Instructions      Right breast infection. Need to get appointment with [facility] for drainage of the abscess. We will treat with the following:  Doxycycline 100 mg twice daily for 10 days Toradol 10mg  every 6 hours as needed for pain Call the Breast Center on Monday to schedule the appointment for Mammogram and ultrasound. Return to urgent care or PCP if symptoms worsen or fail to resolve.     ED Prescriptions     Medication Sig Dispense Auth. Provider   doxycycline (VIBRAMYCIN) 100 MG capsule Take 1 capsule (100 mg total) by mouth 2 (two) times daily. 20 capsule Zayanna Pundt A, PA-C   ketorolac (TORADOL) 10 MG tablet Take 1 tablet (10 mg total) by mouth every 6 (six) hours as needed. 20 tablet Landis Martins, [place]      PDMP not reviewed this encounter.   Landis Martins, [place] 02/20/23 [phone]

## 2023-02-20 NOTE — ED Triage Notes (Signed)
Patient here today with c/o right side breast pain X 1 month.

## 2023-02-24 ENCOUNTER — Other Ambulatory Visit: Payer: Self-pay | Admitting: Internal Medicine

## 2023-02-24 ENCOUNTER — Ambulatory Visit
Admission: RE | Admit: 2023-02-24 | Discharge: 2023-02-24 | Disposition: A | Discharge status: Home / Self Care | Payer: Medicaid Other | Source: Ambulatory Visit | Attending: Internal Medicine | Admitting: Internal Medicine

## 2023-02-24 DIAGNOSIS — N644 Mastodynia: Secondary | ICD-10-CM

## 2023-02-24 DIAGNOSIS — N6320 Unspecified lump in the left breast, unspecified quadrant: Secondary | ICD-10-CM

## 2023-02-24 DIAGNOSIS — N6310 Unspecified lump in the right breast, unspecified quadrant: Secondary | ICD-10-CM

## 2023-03-01 ENCOUNTER — Ambulatory Visit
Admission: RE | Admit: 2023-03-01 | Discharge: 2023-03-01 | Disposition: A | Discharge status: Home / Self Care | Payer: Medicaid Other | Source: Ambulatory Visit | Attending: Internal Medicine | Admitting: Internal Medicine

## 2023-03-01 DIAGNOSIS — N6310 Unspecified lump in the right breast, unspecified quadrant: Secondary | ICD-10-CM

## 2023-03-01 DIAGNOSIS — N644 Mastodynia: Secondary | ICD-10-CM

## 2023-03-01 HISTORY — PX: BREAST BIOPSY: SHX20

## 2023-03-04 LAB — SURGICAL PATHOLOGY

## 2023-03-10 ENCOUNTER — Encounter: Payer: Medicaid Other | Admitting: Internal Medicine

## 2023-03-18 ENCOUNTER — Other Ambulatory Visit: Payer: Self-pay | Admitting: Surgery

## 2023-04-05 ENCOUNTER — Ambulatory Visit: Payer: Medicaid Other | Attending: Internal Medicine | Admitting: Internal Medicine

## 2023-04-05 VITALS — BP 114/79 | HR 81 | Temp 98.4°F | Ht 61.0 in | Wt 157.0 lb

## 2023-04-05 DIAGNOSIS — N644 Mastodynia: Secondary | ICD-10-CM | POA: Diagnosis not present

## 2023-04-05 DIAGNOSIS — Z5986 Financial insecurity: Secondary | ICD-10-CM | POA: Diagnosis not present

## 2023-04-05 DIAGNOSIS — F330 Major depressive disorder, recurrent, mild: Secondary | ICD-10-CM | POA: Diagnosis not present

## 2023-04-05 DIAGNOSIS — I10 Essential (primary) hypertension: Secondary | ICD-10-CM | POA: Diagnosis not present

## 2023-04-05 NOTE — Progress Notes (Signed)
 Patient ID: Colleen Shaw, female    DOB: 28-Jul-1995  MRN: [id number]  CC: Hypertension (HTN f/u. Med refill. /No questions / concerns/No to flu vax. Yes to pap for another appt)   Subjective: Colleen Shaw is a 28 y.o. female who presents for chronic ds management. On schedule as physical but pt states she does not want physical today; open to scheduling later Her concerns today include:  Patient with history of HTN, MDD/anxiety, anemia  Discussed the use of AI scribe software for clinical note transcription with the patient, who gave verbal consent to proceed.  History of Present Illness   HTN:  She has been off her antihypertensive medication, amlodipine  5mg , for a few weeks. She has not been monitoring her blood pressure at home, but it was checked during the visit and was within normal limits. She admits to not limiting her salt intake as much as she should.  The patient has been experiencing issues with her RT breasts, including pain, swelling, and spontaneous drainage since 01/2023. She was seen at an urgent care center in late December for these symptoms and was started on antibiotics. She also had a mammogram and ultrasound which showed large RT breast mass; breast biopsy performed. The biopsy results showed benign breast tissue with marked acute and chronic inflammation and a foreign body giant cell reaction. She saw a careers adviser at [facility] in late January who prescribed doxycycline  and tramadol. She will see him again in 2 days in f/u to determine the need for surgery.  She had requested refill on ketorolac  that was prescribed for her at urgent care.  However on further discussion, she decided that the tramadol prescribed by the surgeon is adequate and sufficient in controlling the pain.  MDD/GAD: Pos depression screen; she admits that this is still an issue for her. Had telehealth visit with Dr. Newlin in October of last year for MDD and insomnia.  She has spoken with our LCSW 1 time since  then.  Some of issues that contribute include being single parent with 3 kids, does not deal with family well. No SI.  She remains skeptical about being placed on medication but thinks that counseling would be helpful. Anx not a major issue  HM: She declined a flu shot     Patient Active Problem List   Diagnosis Date Noted   Essential hypertension 12/10/2021   Breast pain, left 12/10/2021   Major depressive disorder, single episode, mild (HCC) 12/10/2021   Anxiety 12/10/2021   Miscarriage 08/03/2018   Normal labor 05/02/2016   Supervision of high risk pregnancy, antepartum 02/19/2016   Hx of preeclampsia, prior pregnancy, currently pregnant 02/19/2016   Late prenatal care affecting pregnancy, antepartum 02/19/2016   Vaginal bleeding before [redacted] weeks gestation 12/25/2015     Current Outpatient Medications on File Prior to Visit  Medication Sig Dispense Refill   amLODipine  (NORVASC ) 5 MG tablet Take 1 tablet (5 mg total) by mouth daily. 90 tablet 0   doxycycline  (MONODOX ) 100 MG capsule Take 100 mg by mouth 2 (two) times daily.     traMADol (ULTRAM) 50 MG tablet Take 50 mg by mouth every 6 (six) hours as needed.     No current facility-administered medications on file prior to visit.    Allergies  Allergen Reactions   Food Hives, Swelling and Other (See Comments)    Pt is allergic to strawberries.   Reaction:  All over body swelling     Social History   Socioeconomic  History   Marital status: Single    Spouse name: Not on file   Number of children: 3   Years of education: Not on file   Highest education level: 12th grade  Occupational History   Occupation: Care Giver  Tobacco Use   Smoking status: Never   Smokeless tobacco: Never  Vaping Use   Vaping status: Never Used  Substance and Sexual Activity   Alcohol use: Yes    Comment: speical occasions only   Drug use: Not Currently    Types: Marijuana    Comment: Quit 3 yrs ago   Sexual activity: Not Currently     Birth control/protection: None  Other Topics Concern   Not on file  Social History Narrative   Not on file   Social Drivers of Health   Financial Resource Strain: Medium Risk (04/05/2023)   Overall Financial Resource Strain (CARDIA)    Difficulty of Paying Living Expenses: Somewhat hard  Food Insecurity: No Food Insecurity (04/05/2023)   Hunger Vital Sign    Worried About Running Out of Food in the Last Year: Never true    Ran Out of Food in the Last Year: Never true  Transportation Needs: No Transportation Needs (04/05/2023)   PRAPARE - Administrator, Civil Service (Medical): No    Lack of Transportation (Non-Medical): No  Physical Activity: Inactive (04/05/2023)   Exercise Vital Sign    Days of Exercise per Week: 0 days    Minutes of Exercise per Session: 0 min  Stress: Stress Concern Present (04/05/2023)   [employer] of Occupational Health - Occupational Stress Questionnaire    Feeling of Stress : To some extent  Social Connections: Socially Isolated (04/05/2023)   Social Connection and Isolation Panel [NHANES]    Frequency of Communication with Friends and Family: Once a week    Frequency of Social Gatherings with Friends and Family: Never    Attends Religious Services: Never    Database Administrator or Organizations: No    Attends Banker Meetings: Never    Marital Status: Never married  Intimate Partner Violence: Not At Risk (04/05/2023)   Humiliation, Afraid, Rape, and Kick questionnaire    Fear of Current or Ex-Partner: No    Emotionally Abused: No    Physically Abused: No    Sexually Abused: No    Family History  Problem Relation Age of Onset   Hypertension Mother    Asthma Father    Hypertension Paternal Grandmother     Past Surgical History:  Procedure Laterality Date   BREAST BIOPSY Right 03/01/2023   US  RT BREAST BX W LOC DEV 1ST LESION IMG BX SPEC US  GUIDE 03/01/2023 [facility] MAMMOGRAPHY   NO PAST SURGERIES      ROS: Review of  Systems Negative except as stated above  PHYSICAL EXAM: BP 114/79 (BP Location: Left Arm, Patient Position: Sitting, Cuff Size: Normal)   Pulse 81   Temp 98.4 F (36.9 C) (Oral)   Ht 5' 1 (1.549 m)   Wt 157 lb (71.2 kg)   SpO2 100%   BMI 29.66 kg/m   Physical Exam  General appearance - alert, well appearing, and in no distress Mental status - normal mood, behavior, speech, dress, motor activity, and thought processes Chest - clear to auscultation, no wheezes, rales or rhonchi, symmetric air entry Heart - normal rate, regular rhythm, normal S1, S2, no murmurs, rubs, clicks or gallops Breasts - RT breast: Breast is edematous with dimpling  of the skin especially of the lower half.  Nipple is inverted.  Some yellowish drainage noted from the nipple.  Breast is tender to touch.  Exam done with my CMA, Clarissa, present as chaperone. Extremities - peripheral pulses normal, no pedal edema, no clubbing or cyanosis     04/05/2023   10:30 AM 12/10/2021    8:59 AM 04/28/2016    2:59 PM  Depression screen PHQ 2/9  Decreased Interest 1 1 1   Down, Depressed, Hopeless 3 2 1   PHQ - 2 Score 4 3 2   Altered sleeping 3 3 1   Tired, decreased energy 2 2 1   Change in appetite 0 1 1  Feeling bad or failure about yourself  2 1 0  Trouble concentrating 1 1 0  Moving slowly or fidgety/restless 2 0 0  Suicidal thoughts 0 0 0  PHQ-9 Score 14 11 5   Difficult doing work/chores Somewhat difficult        04/05/2023   10:31 AM 12/10/2021    9:00 AM 04/28/2016    2:59 PM 02/25/2016   10:39 AM  GAD 7 : Generalized Anxiety Score  Nervous, Anxious, on Edge 2 1 1 1   Control/stop worrying 3 3 1 1   Worry too much - different things 3 3 2 1   Trouble relaxing 2 2 1 2   Restless 2 1 0 2  Easily annoyed or irritable 3 1 1 3   Afraid - awful might happen 1 1 0 0  Total GAD 7 Score 16 12 6 10   Anxiety Difficulty Somewhat difficult            Latest Ref Rng & Units 12/10/2021   10:01 AM 05/08/2016   10:19 AM  05/02/2016    9:12 AM  CMP  Glucose 70 - 99 mg/dL 87  90  77   BUN 6 - 20 mg/dL 16  13  9    Creatinine 0.57 - 1.00 mg/dL 9.30  9.41  9.40   Sodium 134 - 144 mmol/L 139  136  135   Potassium 3.5 - 5.2 mmol/L 4.5  3.4  3.7   Chloride 96 - 106 mmol/L 101  103  106   CO2 20 - 29 mmol/L 23  24  21    Calcium 8.7 - 10.2 mg/dL 9.7  8.2  8.4   Total Protein 6.0 - 8.5 g/dL 7.8  7.2  6.7   Total Bilirubin 0.0 - 1.2 mg/dL <9.7  0.1  0.6   Alkaline Phos 44 - 121 IU/L 95  157  250   AST 0 - 40 IU/L 17  25  22    ALT 0 - 32 IU/L 9  15  9     Lipid Panel  No results found for: CHOL, TRIG, HDL, CHOLHDL, VLDL, LDLCALC, LDLDIRECT  CBC    Component Value Date/Time   WBC 7.2 12/10/2021 1001   WBC 6.5 07/29/2018 0911   RBC 4.34 12/10/2021 1001   RBC 4.01 07/29/2018 0911   HGB 10.8 (L) 12/10/2021 1001   HCT 35.8 12/10/2021 1001   PLT 513 (H) 12/10/2021 1001   MCV 83 12/10/2021 1001   MCV 85 01/02/2014 0411   MCH 24.9 (L) 12/10/2021 1001   MCH 27.2 07/29/2018 0911   MCHC 30.2 (L) 12/10/2021 1001   MCHC 32.7 07/29/2018 0911   RDW 14.2 12/10/2021 1001   RDW 13.6 01/02/2014 0411   LYMPHSABS 2,353 02/19/2016 1058   MONOABS 1,810 (H) 02/19/2016 1058   EOSABS 362 02/19/2016 1058  BASOSABS 0 02/19/2016 1058    ASSESSMENT AND PLAN: 1. Essential hypertension (Primary) Patient has been off amlodipine  for 2 weeks.  Blood pressure is normal.  Advised that we observe off medication.  Advised to check blood pressure at least once a week if she goes to any pharmacy or [facility] that has a blood pressure device.  Bring readings with her on next visit.  We will schedule her for her well woman exam in several weeks.  2. Major depressive disorder, recurrent episode, mild (HCC) -Patient not interested in being placed on medication but is agreeable to referral for counseling. - Ambulatory referral to Psychiatry  3. Breast pain, right Keep upcoming appointment with surgeon later this week.  Continue  antibiotic doxycycline  until then.    Patient was given the opportunity to ask questions.  Patient verbalized understanding of the plan and was able to repeat key elements of the plan.   This documentation was completed using Paediatric nurse.  Any transcriptional errors are unintentional.  Orders Placed This Encounter  Procedures   Ambulatory referral to Psychiatry     Requested Prescriptions    No prescriptions requested or ordered in this encounter    Return in about 4 weeks (around 05/03/2023) for PAP/well woman.  Barnie Louder, MD, FACP

## 2023-04-05 NOTE — Patient Instructions (Signed)
Your blood pressure is in normal.  Hold off on taking the amlodipine.

## 2023-05-16 ENCOUNTER — Ambulatory Visit: Payer: Medicaid Other | Admitting: Internal Medicine

## 2023-07-05 ENCOUNTER — Ambulatory Visit: Attending: Internal Medicine | Admitting: Internal Medicine

## 2024-02-13 ENCOUNTER — Ambulatory Visit (INDEPENDENT_AMBULATORY_CARE_PROVIDER_SITE_OTHER)

## 2024-02-13 ENCOUNTER — Ambulatory Visit: Payer: Self-pay | Admitting: Urgent Care

## 2024-02-13 ENCOUNTER — Ambulatory Visit
Admission: EM | Admit: 2024-02-13 | Discharge: 2024-02-13 | Disposition: A | Discharge status: Home / Self Care | Attending: Family Medicine | Admitting: Family Medicine

## 2024-02-13 DIAGNOSIS — R202 Paresthesia of skin: Secondary | ICD-10-CM | POA: Diagnosis not present

## 2024-02-13 DIAGNOSIS — M79671 Pain in right foot: Secondary | ICD-10-CM | POA: Diagnosis not present

## 2024-02-13 DIAGNOSIS — R200 Anesthesia of skin: Secondary | ICD-10-CM | POA: Diagnosis not present

## 2024-02-13 DIAGNOSIS — I10 Essential (primary) hypertension: Secondary | ICD-10-CM | POA: Diagnosis not present

## 2024-02-13 DIAGNOSIS — M722 Plantar fascial fibromatosis: Secondary | ICD-10-CM

## 2024-02-13 LAB — GLUCOSE, POCT (MANUAL RESULT ENTRY): POCT Glucose (KUC): 80 mg/dL (ref 70–99)

## 2024-02-13 MED ORDER — AMLODIPINE BESYLATE 5 MG PO TABS: 5.0000 mg | ORAL_TABLET | Freq: Every day | ORAL | 0 refills | Status: DC

## 2024-02-13 MED ORDER — DICLOFENAC SODIUM 3 % EX GEL: 1.0000 g | Freq: Two times a day (BID) | CUTANEOUS | 0 refills | Status: AC

## 2024-02-13 NOTE — ED Provider Notes (Signed)
 [facility]  Note:  This document was prepared using Conservation officer, historic buildings and may include unintentional dictation errors.  MRN: [id number] DOB: Dec 28, 1995  Subjective:   Colleen Shaw is a 28 y.o. female presenting for 2 chief complaints.    Reports acute onset of right foot pain over night. Pain is over the right heel. No fall, trauma, wound, drainage, bleeding.  Does a lot of standing, walking for daily activities.  Has 3 children and is very active with them. Reports history of hypertension.  Is out of her amlodipine .  No headaches, chest pain, shortness of breath, confusion, weakness, numbness or tingling.  No nausea, vomiting, abdominal pain.  No smoking.  No diabetes.  No drug use.  No alcohol use.  Current Outpatient Medications  Medication Instructions   amLODipine  (NORVASC ) 5 mg, Oral, Daily   doxycycline  (MONODOX ) 100 mg, 2 times daily   traMADol (ULTRAM) 50 mg, Every 6 hours PRN    Allergies[1]  Past Medical History:  Diagnosis Date   Group beta Strep positive    Pregnancy induced hypertension 2015     Past Surgical History:  Procedure Laterality Date   BREAST BIOPSY Right 03/01/2023   US  RT BREAST BX W LOC DEV 1ST LESION IMG BX SPEC US  GUIDE 03/01/2023 [facility] MAMMOGRAPHY   NO PAST SURGERIES      Family History  Problem Relation Age of Onset   Hypertension Mother    Asthma Father    Hypertension Paternal Grandmother     Social History   Occupational History   Occupation: Care Giver  Tobacco Use   Smoking status: Never   Smokeless tobacco: Never  Vaping Use   Vaping status: Never Used  Substance and Sexual Activity   Alcohol use: Not Currently   Drug use: Not Currently    Types: Marijuana   Sexual activity: Not Currently    Birth control/protection: None     ROS   Objective:   Vitals: BP (!) (P) 152/98   Pulse 83   Temp 98.3 F (36.8 C) (Oral)   Resp 16   LMP 01/31/2024   SpO2 99%   Physical  Exam Constitutional:      General: She is not in acute distress.    Appearance: Normal appearance. She is well-developed. She is not ill-appearing, toxic-appearing or diaphoretic.  HENT:     Head: Normocephalic and atraumatic.     Nose: Nose normal.     Mouth/Throat:     Mouth: Mucous membranes are moist.  Eyes:     General: No scleral icterus.       Right eye: No discharge.        Left eye: No discharge.     Extraocular Movements: Extraocular movements intact.  Cardiovascular:     Rate and Rhythm: Normal rate.  Pulmonary:     Effort: Pulmonary effort is normal.  Musculoskeletal:       Feet:  Skin:    General: Skin is warm and dry.  Neurological:     General: No focal deficit present.     Mental Status: She is alert and oriented to person, place, and time.  Psychiatric:        Mood and Affect: Mood normal.        Behavior: Behavior normal.    Results for orders placed or performed during the hospital encounter of 02/13/24 (from the past 24 hours)  POCT CBG (manual entry)     Status: Normal  Collection Time: 02/13/24  9:33 AM  Result Value Ref Range   POCT Glucose ([facility]) 80 70 - 99 mg/dL   Assessment and Plan :   PDMP not reviewed this encounter.  1. Plantar fasciitis of right foot   2. Numbness and tingling of foot   3. Right foot pain   4. Essential hypertension      Referral to podiatry for management of plantar fasciitis.  Use diclofenac  gel for topical treatment of her right foot plantar fasciitis.  Essential hypertension is uncontrolled.  Refilled her amlodipine .  Practice hypertensive friendly diet.  Follow-up with her PCP as soon as possible.  Counseled patient on potential for adverse effects with medications prescribed/recommended today, ER and return-to-clinic precautions discussed, patient verbalized understanding.      [1]  Allergies Allergen Reactions   Food Hives, Swelling and Other (See Comments)    Pt is allergic to strawberries.   Reaction:   All over body swelling      Christopher Savannah, [place] 02/13/24 1130

## 2024-02-13 NOTE — ED Triage Notes (Signed)
 Pt c/o pain, tingling burning to bottom of right foot started 3am-denies injury-last dose tylenol  3am-NAD-limping gait

## 2024-02-13 NOTE — Discharge Instructions (Addendum)
 Follow up with podiatry for your plantar fasciitis. Use diclofenac  gel twice daily. If it is not covered by insurance, then let us  know by calling and we will send a prescription for naproxen instead.   I have refilled your blood pressure medication, amlodipine . Follow up with your PCP for more refills and recheck on your blood pressure.  For diabetes or elevated blood sugar, please make sure you are limiting and avoiding starchy, carbohydrate foods like pasta, breads, sweet breads, pastry, rice, potatoes, desserts. These foods can elevate your blood sugar. Also, limit and avoid drinks that contain a lot of sugar such as sodas, sweet teas, fruit juices.  Drinking plain water will be much more helpful, try 64 ounces of water daily.  It is okay to flavor your water naturally by cutting cucumber, lemon, mint or lime, placing it in a picture with water and drinking it over a period of 24-48 hours as long as it remains refrigerated.  For elevated blood pressure, make sure you are monitoring salt in your diet.  Do not eat restaurant foods and limit processed foods at home. I highly recommend you prepare and cook your own foods at home.  Processed foods include things like frozen meals, pre-seasoned meats and dinners, deli meats, canned foods as these foods contain a high amount of sodium/salt.  Make sure you are paying attention to sodium labels on foods you buy at the grocery store. Buy your spices separately such as garlic powder, onion powder, cumin, cayenne, parsley flakes so that you can avoid seasonings that contain salt. However, salt-free seasonings are available and can be used, an example is Mrs. Dash and includes a lot of different mixtures that do not contain salt.  Lastly, when cooking using oils that are healthier for you is important. This includes olive oil, avocado oil, canola oil. We have discussed a lot of foods to avoid but below is a list of foods that can be very healthy to use in your diet  whether it is for diabetes, cholesterol, high blood pressure, or in general healthy eating.  Salads - kale, spinach, cabbage, spring mix, arugula Fruits - avocadoes, berries (blueberries, raspberries, blackberries), apples, oranges, pomegranate, grapefruit, kiwi Vegetables - asparagus, cauliflower, broccoli, green beans, brussel sprouts, bell peppers, beets; stay away from or limit starchy vegetables like potatoes, carrots, peas Other general foods - kidney beans, egg whites, almonds, walnuts, sunflower seeds, pumpkin seeds, fat free yogurt, almond milk, flax seeds, quinoa, oats  Meat - It is better to eat lean meats and limit your red meat including pork to once a week.  Wild caught fish, chicken breast are good options as they tend to be leaner sources of good protein. Still be mindful of the sodium labels for the meats you buy.  DO NOT EAT ANY FOODS ON THIS LIST THAT YOU ARE ALLERGIC TO. For more specific needs, I highly recommend consulting a dietician or nutritionist but this can definitely be a good starting point.

## 2024-02-21 ENCOUNTER — Encounter: Payer: Self-pay | Admitting: *Deleted

## 2024-02-21 ENCOUNTER — Ambulatory Visit
Admission: EM | Admit: 2024-02-21 | Discharge: 2024-02-21 | Disposition: A | Discharge status: Home / Self Care | Attending: Physician Assistant | Admitting: Physician Assistant

## 2024-02-21 ENCOUNTER — Ambulatory Visit: Payer: Self-pay

## 2024-02-21 ENCOUNTER — Other Ambulatory Visit: Payer: Self-pay

## 2024-02-21 DIAGNOSIS — R519 Headache, unspecified: Secondary | ICD-10-CM

## 2024-02-21 MED ORDER — IBUPROFEN 800 MG PO TABS
800.0000 mg | ORAL_TABLET | Freq: Once | ORAL | Status: AC
  Administered 2024-02-21: 800 mg via ORAL

## 2024-02-21 MED ORDER — IBUPROFEN 800 MG PO TABS: 800.0000 mg | ORAL_TABLET | Freq: Three times a day (TID) | ORAL | 0 refills | Status: DC | PRN

## 2024-02-21 NOTE — ED Triage Notes (Signed)
 C/O intermittent HA and nausea onset approx 5 days ago with progressive worsening over past 2 days. STates has been taking amlodipine  as directed. Has also tried Tyl and taking Dramamine.  Denies any fever, cough, rhinorrhea.

## 2024-02-21 NOTE — Telephone Encounter (Signed)
 FYI Only or Action Required?: FYI only for provider: UC advised.  Patient was last seen in primary care on 04/05/2023 by Colleen Barnie NOVAK, MD.  Called Nurse Triage reporting Headache.  Symptoms began a week ago.  Interventions attempted: OTC medications: Tylenol  and Prescription medications: Amlodipine .  Symptoms are: gradually worsening.  Triage Disposition: See HCP Within 4 Hours (Or PCP Triage)  Patient/caregiver understands and will follow disposition?: Yes   Hx of frequent headaches and nausea. This week they have been getting worse and happening more frequently. Occurring multiple times per day and lasting longer. 10/10 pain lately, normally 5/10. Pain decreases some with tylenol . Taking amlodipine  daily, has not missed any doses. Unsure what current BP is, does not have a cuff. Thinks eating high sodium foods and increased stress can exacerbate her headaches. Reports she is currently a single mother to 3 children which has been a source of stress. Tingling in either arm or hand that comes and goes starting a few months ago, alternates between sides. None at this time. Denies numbness or weakness on one side of body. Denies changes to speech, ambulating fine. Reports seeing floaters. Denies fever. Nausea without emesis. No appts in office today, advised UC. Pt planning to go today, pt also asking to schedule new soonest appointment in office to f/u on headaches later. Scheduled appt with different provider at home office on 03/07/24 d/t no PCP availability at a time that fits pt schedule. Advised ED for worsening symptoms.     Went to UC on 12/15 for tingling in bottom of right foot with redness and swelling. Was out of amlodipine  at that time. BP 152/98. Dx with plantar fasciitis.    Copied from CRM [id number]. Topic: Clinical - Red Word Triage >> Feb 21, 2024 10:15 AM Colleen Shaw wrote: Red Word that prompted transfer to Nurse Triage: bad headache and nausea and feels weird Reason for  Disposition  [1] SEVERE headache (e.g., excruciating) AND [2] not improved after 2 hours of pain medicine  Answer Assessment - Initial Assessment Questions 1. LOCATION: Where does it hurt?      Top middle  2. ONSET: When did the headache start? (e.g., minutes, hours, days)      Getting worse over the past week, hx of chronic less severe headaches  3. PATTERN: Does the pain come and go, or has it been constant since it started?     Occurring multiple times per day and lasting longer  4. SEVERITY: How bad is the pain? and What does it keep you from doing?  (e.g., Scale 1-10; mild, moderate, or severe)     10/10  5. RECURRENT SYMPTOM: Have you ever had headaches before? If Yes, ask: When was the last time? and What happened that time?      Yes, normally managed with tylenol , also takes amlodipine  for htn  6. CAUSE: What do you think is causing the headache?     Unsure  7. MIGRAINE: Have you been diagnosed with migraine headaches? If Yes, ask: Is this headache similar?      Hx of headaches  8. HEAD INJURY: Has there been any recent injury to your head?      High sodium, stress, yelling at her kids  9. OTHER SYMPTOMS: Do you have any other symptoms? (e.g., fever, stiff neck, eye pain, sore throat, cold symptoms)     Can have tingling in arm and hand that comes and goes starting a few months ago, alternates between sides. Nausea, no  emesis.  10. PREGNANCY: Is there any chance you are pregnant? When was your last menstrual period?       Denies  Protocols used: Headache-A-AH

## 2024-02-21 NOTE — Discharge Instructions (Signed)
 Return if any problems.

## 2024-02-21 NOTE — ED Provider Notes (Signed)
 " [facility]    CSN: [id number] Arrival date & time: 02/21/24  1251      History   Chief Complaint Chief Complaint  Patient presents with   Headache    HPI Colleen Shaw is a 28 y.o. female.   Pt complains of a headache.  Pt reports normally when she has a headache it is because of her blood pressure.  Pt took blood pressure medication this am. Pt denies fever,  Pt reports she feels bad overall.  No fever, sore throat or cough  The history is provided by the patient. No language interpreter was used.  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   Anxiety 12/10/2021   Miscarriage 08/03/2018   Normal labor 05/02/2016   Supervision of high risk pregnancy, antepartum 02/19/2016   Hx of preeclampsia, prior pregnancy, currently pregnant 02/19/2016   Late prenatal care affecting pregnancy, antepartum 02/19/2016   Vaginal bleeding before [redacted] weeks gestation 12/25/2015    Past Surgical History:  Procedure Laterality Date   BREAST BIOPSY Right 03/01/2023   US  RT BREAST BX W LOC DEV 1ST LESION IMG BX SPEC US  GUIDE 03/01/2023 [facility]    OB History     Gravida  4   Para  3   Term  2   Preterm  1   AB      Living  3      SAB      IAB      Ectopic      Multiple  0   Live Births  3            Home Medications    Prior to Admission medications  Medication Sig Start Date End Date Taking? Authorizing Provider  amLODipine  (NORVASC ) 5 MG tablet Take 1 tablet (5 mg total) by mouth daily.  02/13/24  Yes Christopher Savannah, PA-C  Diclofenac  Sodium 3 % GEL Apply 1 g topically 2 (two) times daily. 02/13/24   Christopher Savannah, PA-C  doxycycline  (MONODOX ) 100 MG capsule Take 100 mg by mouth 2 (two) times daily. 04/04/23   [provider]  traMADol (ULTRAM) 50 MG tablet Take 50 mg by mouth every 6 (six) hours as needed. 04/04/23   [provider]    Family History Family History  Problem Relation Age of Onset   Hypertension Mother    Asthma Father    Hypertension Paternal Grandmother     Social History Social History[1]   Allergies   Food   Review of Systems Review of Systems  Neurological:  Positive for headaches.  All other systems reviewed and are negative.    Physical Exam Triage Vital Signs ED Triage Vitals  Encounter Vitals Group     BP 02/21/24 1418 137/89     Girls Systolic BP Percentile --      Girls Diastolic BP Percentile --      Boys Systolic BP Percentile --      Boys Diastolic BP Percentile --      Pulse Rate 02/21/24  1418 88     Resp 02/21/24 1418 15     Temp 02/21/24 1418 98.8 F (37.1 C)     Temp Source 02/21/24 1418 Oral     SpO2 02/21/24 1418 98 %     Weight --      Height --      Head Circumference --      Peak Flow --      Pain Score 02/21/24 1425 8     Pain Loc --      Pain Education --      Exclude from Growth Chart --    No data found.  Updated Vital Signs BP 137/89 (BP Location: Left Arm)   Pulse 88   Temp 98.8 F (37.1 C) (Oral)   Resp 15   LMP 01/31/2024 (Approximate)   SpO2 98%   Visual Acuity Right Eye Distance:   Left Eye Distance:   Bilateral Distance:    Right Eye Near:   Left Eye Near:    Bilateral Near:     Physical Exam Vitals and nursing note reviewed.  Constitutional:      Appearance: She is well-developed.  HENT:     Head: Normocephalic.  Cardiovascular:     Rate and Rhythm: Normal rate.  Pulmonary:     Effort: Pulmonary effort is normal.  Abdominal:     General: There is no  distension.  Musculoskeletal:        General: Normal range of motion.     Cervical back: Normal range of motion.  Skin:    General: Skin is warm.  Neurological:     General: No focal deficit present.     Mental Status: She is alert and oriented to person, place, and time.  Psychiatric:        Mood and Affect: Mood normal.      UC Treatments / Results  Labs (all labs ordered are listed, but only abnormal results are displayed) Labs Reviewed - No data to display  EKG   Radiology No results found.  Procedures Procedures (including critical care time)  Medications Ordered in UC Medications  ibuprofen  (ADVIL ) tablet 800 mg (has no administration in time range)    Initial Impression / Assessment and Plan / UC Course  I have reviewed the triage vital signs and the nursing notes.  Pertinent labs & imaging results that were available during my care of the patient were reviewed by me and considered in my medical decision making (see chart for details).    Patient is given ibuprofen  here for headache today.  She is advised to continue ibuprofen .  Patient is advised if headache worsens or change she should return for recheck.  Patient is discharged in stable condition  Final Clinical Impressions(s) / UC Diagnoses   Final diagnoses:  Nonintractable headache, unspecified chronicity pattern, unspecified headache type   Discharge Instructions   None    ED Prescriptions   None    PDMP not reviewed this encounter. An After Visit Summary was printed and given to the patient.        [1]  Social History Tobacco Use   Smoking status: Never   Smokeless tobacco: Never  Vaping Use   Vaping status: Never Used  Substance Use Topics   Alcohol use: Not Currently   Drug use: Not Currently    Types: Marijuana     Flint Sonny POUR, [place] 02/21/24 1650  "

## 2024-02-22 ENCOUNTER — Ambulatory Visit: Payer: Self-pay

## 2024-02-22 NOTE — Telephone Encounter (Signed)
" ° ° ° ° °  Copied from CRM [id number]. Topic: Clinical - Red Word Triage >> Feb 22, 2024 12:17 PM Shanda MATSU wrote: Red Word that prompted transfer to Nurse Triage: Patient is reporting still having nausea and weakness, patient stated that she went to UC yesterday and they told her that her bp was a little elevated but all her other vitals are fine, patient also stated she was prescribed Ibuprofen  800mg , patient stated she is still not feeling well. "

## 2024-02-22 NOTE — Telephone Encounter (Signed)
 FYI Only or Action Required?: FYI only for provider: appointment scheduled on 02/27/24.  Patient was last seen in primary care on 04/05/2023 by Vicci Barnie NOVAK, MD.  Called Nurse Triage reporting Headache.  Symptoms began several days ago.  Interventions attempted: OTC medications:   and Rest, hydration, or home remedies.  Symptoms are: unchanged.  Triage Disposition: Home Care, See HCP Within 4 Hours (Or PCP Triage)  Patient/caregiver understands and will follow disposition?: Yes    Reason for Disposition  Unexplained nausea  Answer Assessment - Initial Assessment Questions Seen in UC yesterday. Calling in today concerned about ongoing nausea.     1. NAUSEA SEVERITY: How bad is the nausea? (e.g., mild, moderate, severe; dehydration, weight loss)     Moderate taking liquids 2. ONSET: When did the nausea begin?     Few days ago 3. VOMITING: Any vomiting? If Yes, ask: How many times today?     no  Protocols used: Nausea-A-AH

## 2024-02-22 NOTE — Telephone Encounter (Signed)
 Noted

## 2024-02-24 ENCOUNTER — Telehealth: Payer: Self-pay | Admitting: Internal Medicine

## 2024-02-24 ENCOUNTER — Ambulatory Visit: Payer: Self-pay

## 2024-02-24 NOTE — Telephone Encounter (Signed)
 Contacted patient; appointment confirmed for 12/29.

## 2024-02-24 NOTE — Telephone Encounter (Signed)
 FYI Only or Action Required?: Action required by provider: Pt advised to call nearby dentistry to schedule appt. Pt requesting further recommendations from PCP.  Patient was last seen in primary care on 04/05/2023 by Vicci Barnie NOVAK, MD.  Called Nurse Triage reporting Dental Pain.  Symptoms began yesterday.  Interventions attempted: OTC medications: 800mg  Ibuprofen .  Symptoms are: gradually worsening.  Triage Disposition: Call Dentist When Office is Open  Patient/caregiver understands and will follow disposition?: Yes  Reason for Disposition  Toothache present > 24 hours  Answer Assessment - Initial Assessment Questions Pt started having a tooth ache 02/23/24. No nausea, headache. Ate something yesterday and developed a tooth ache.   Taking 800mg  ibuprofen . Hurts on the backs side of patients left side of mouth.   Pain 7/10  1. LOCATION: Which tooth is hurting?  (e.g., right-side/left-side, upper/lower, front/back)     Left - pt thinks upper and lower. 2. ONSET: When did the toothache start?  (e.g., hours, days)      02/22/25 3. SEVERITY: How bad is the toothache?  (Scale 1-10; mild, moderate or severe)     7/20 4. SWELLING: Is there any visible swelling of your face?     denies 5. OTHER SYMPTOMS: Do you have any other symptoms? (e.g., fever)     denies  Protocols used: Toothache-A-AH  Copied from CRM #[id number]. Topic: Clinical - Red Word Triage >> Feb 24, 2024 11:41 AM Amy B wrote: Red Word that prompted transfer to Nurse Triage: Head, nausea, toothache

## 2024-02-24 NOTE — Telephone Encounter (Signed)
 No appointment available today.  Patient has an appointment for Monday December 29th with Provider Theotis which is noted to be the soonest appointment.   Will call patient and advise UC if sx's persist or worsen. Patient verbalized understanding.

## 2024-02-27 ENCOUNTER — Encounter: Payer: Self-pay | Admitting: Nurse Practitioner

## 2024-02-27 ENCOUNTER — Ambulatory Visit: Payer: Self-pay | Attending: Nurse Practitioner | Admitting: Nurse Practitioner

## 2024-02-27 VITALS — BP 129/88 | HR 82 | Ht 61.0 in | Wt 174.6 lb

## 2024-02-27 DIAGNOSIS — R5382 Chronic fatigue, unspecified: Secondary | ICD-10-CM | POA: Diagnosis not present

## 2024-02-27 DIAGNOSIS — I10 Essential (primary) hypertension: Secondary | ICD-10-CM

## 2024-02-27 DIAGNOSIS — G44229 Chronic tension-type headache, not intractable: Secondary | ICD-10-CM

## 2024-02-27 MED ORDER — IBUPROFEN 800 MG PO TABS: 800.0000 mg | ORAL_TABLET | Freq: Three times a day (TID) | ORAL | 1 refills | Status: AC | PRN

## 2024-02-27 MED ORDER — AMLODIPINE BESYLATE 5 MG PO TABS: 5.0000 mg | ORAL_TABLET | Freq: Every day | ORAL | 1 refills | Status: AC

## 2024-02-27 NOTE — Progress Notes (Signed)
 "  Assessment & Plan:  Colleen Shaw was seen today for fatigue and nausea.  Diagnoses and all orders for this visit:  Primary hypertension -     amLODipine  (NORVASC ) 5 MG tablet; Take 1 tablet (5 mg total) by mouth daily. Continue amlodipine  as prescribed.  Reminded to bring in blood pressure log for follow  up appointment.  RECOMMENDATIONS: DASH/Mediterranean Diets are healthier choices for HTN.    Chronic fatigue -     VITAMIN D 25 Hydroxy (Vit-D Deficiency, Fractures) -     CMP14+EGFR -     CBC with Differential -     Iron, TIBC and Ferritin Panel -     Thyroid Panel With TSH  Chronic tension-type headache, not intractable -     CBC with Differential -     Iron, TIBC and Ferritin Panel -     ibuprofen  (ADVIL ) 800 MG tablet; Take 1 tablet (800 mg total) by mouth every 8 (eight) hours as needed.    Patient has been counseled on age-appropriate routine health concerns for screening and prevention. These are reviewed and up-to-date. Referrals have been placed accordingly. Immunizations are up-to-date or declined.    Subjective:   Chief Complaint  Patient presents with   Fatigue   Nausea    Colleen Shaw 28 y.o. female presents to office today with concerns of chronic fatigue, headaches and nausea. She is a patient of Dr. Vicci.   She has a history of IDA. She is no longer taking iron tablets. Last labs drawn in 2023. She does  not experience heavy menstrual periods. Her last day of her most recent menstrual cycle was yesterday.   Blood pressure slightly above goal but would not consider this a factor in her fatigue or nausea. She is taking amlodipine  5 mg daily.  BP Readings from Last 3 Encounters:  02/27/24 129/88  02/21/24 137/89  02/13/24 (!) 142/110     She experiences intermittent nausea and fatigue. She reports a tingling sensation in her feet, primarily occurring during sleep, causing her to wake up with her feet feeling 'asleep.' Additionally, she mentions a sensation  of potential loss of feeling in her left arm that occurs from time to time.    Review of Systems  Constitutional:  Positive for malaise/fatigue.  HENT:  Negative for nosebleeds.   Respiratory: Negative.    Cardiovascular:  Negative for chest pain.  Gastrointestinal:  Positive for nausea.  Genitourinary: Negative.   Neurological:  Positive for tingling, sensory change and headaches.  Endo/Heme/Allergies:  Does not bruise/bleed easily.    Past Medical History:  Diagnosis Date   Group beta Strep positive    Pregnancy induced hypertension 2015    Past Surgical History:  Procedure Laterality Date   BREAST BIOPSY Right 03/01/2023   US  RT BREAST BX W LOC DEV 1ST LESION IMG BX SPEC US  GUIDE 03/01/2023 [facility]    Family History  Problem Relation Age of Onset   Hypertension Mother    Asthma Father    Hypertension Paternal Grandmother     Social History Reviewed with no changes to be made today.   Outpatient Medications Prior to Visit  Medication Sig Dispense Refill   Diclofenac  Sodium 3 % GEL Apply 1 g topically 2 (two) times daily. 100 g 0   amLODipine  (NORVASC ) 5 MG tablet Take 1 tablet (5 mg total) by mouth daily. 90 tablet 0   ibuprofen  (ADVIL ) 800 MG tablet Take 1 tablet (800 mg total) by mouth every 8 (  eight) hours as needed. 30 tablet 0   doxycycline  (MONODOX ) 100 MG capsule Take 100 mg by mouth 2 (two) times daily. (Patient not taking: Reported on 02/27/2024)     traMADol (ULTRAM) 50 MG tablet Take 50 mg by mouth every 6 (six) hours as needed. (Patient not taking: Reported on 02/27/2024)     No facility-administered medications prior to visit.    Allergies[1]     Objective:    BP 129/88 (BP Location: Left Arm, Patient Position: Sitting, Cuff Size: Normal)   Pulse 82   Ht 5' 1 (1.549 m)   Wt 174 lb 9.6 oz (79.2 kg)   LMP 02/23/2024 (Exact Date)   SpO2 100%   BMI 32.99 kg/m  Wt Readings from Last 3 Encounters:  02/27/24 174 lb 9.6 oz (79.2 kg)   04/05/23 157 lb (71.2 kg)  12/10/21 169 lb (76.7 kg)    Physical Exam Vitals and nursing note reviewed.  Constitutional:      Appearance: She is well-developed.  HENT:     Head: Normocephalic and atraumatic.  Cardiovascular:     Rate and Rhythm: Normal rate and regular rhythm.     Heart sounds: Normal heart sounds. No murmur heard.    No friction rub. No gallop.  Pulmonary:     Effort: Pulmonary effort is normal. No tachypnea or respiratory distress.     Breath sounds: Normal breath sounds. No decreased breath sounds, wheezing, rhonchi or rales.  Chest:     Chest wall: No tenderness.  Musculoskeletal:        General: Normal range of motion.     Cervical back: Normal range of motion.  Skin:    General: Skin is warm and dry.  Neurological:     Mental Status: She is alert and oriented to person, place, and time.     Coordination: Coordination normal.  Psychiatric:        Behavior: Behavior normal. Behavior is cooperative.        Thought Content: Thought content normal.        Judgment: Judgment normal.          Patient has been counseled extensively about nutrition and exercise as well as the importance of adherence with medications and regular follow-up. The patient was given clear instructions to go to ER or return to medical center if symptoms don't improve, worsen or new problems develop. The patient verbalized understanding.   Follow-up: No follow-ups on file.   Haze LELON Servant, FNP-BC [facility] [place], [place] [phone]   02/27/2024, 2:57 PM    [1]  Allergies Allergen Reactions   Food Hives, Swelling and Other (See Comments)    Pt is allergic to strawberries.   Reaction:  All over body swelling    "

## 2024-02-28 ENCOUNTER — Ambulatory Visit: Payer: Self-pay | Admitting: Nurse Practitioner

## 2024-02-28 DIAGNOSIS — E559 Vitamin D deficiency, unspecified: Secondary | ICD-10-CM

## 2024-02-28 LAB — CMP14+EGFR
ALT: 9 IU/L (ref 0–32)
AST: 18 IU/L (ref 0–40)
Albumin: 4.2 g/dL (ref 4.0–5.0)
Alkaline Phosphatase: 88 IU/L (ref 41–116)
BUN/Creatinine Ratio: 21 (ref 9–23)
BUN: 15 mg/dL (ref 6–20)
Bilirubin Total: 0.3 mg/dL (ref 0.0–1.2)
CO2: 24 mmol/L (ref 20–29)
Calcium: 9.7 mg/dL (ref 8.7–10.2)
Chloride: 106 mmol/L (ref 96–106)
Creatinine, Ser: 0.7 mg/dL (ref 0.57–1.00)
Globulin, Total: 3.6 g/dL (ref 1.5–4.5)
Glucose: 96 mg/dL (ref 70–99)
Potassium: 4.1 mmol/L (ref 3.5–5.2)
Sodium: 142 mmol/L (ref 134–144)
Total Protein: 7.8 g/dL (ref 6.0–8.5)
eGFR: 119 mL/min/1.73

## 2024-02-28 LAB — CBC WITH DIFFERENTIAL/PLATELET
Basophils Absolute: 0 x10E3/uL (ref 0.0–0.2)
Basos: 0 %
EOS (ABSOLUTE): 0.2 x10E3/uL (ref 0.0–0.4)
Eos: 3 %
Hematocrit: 36.2 % (ref 34.0–46.6)
Hemoglobin: 11.2 g/dL (ref 11.1–15.9)
Immature Grans (Abs): 0 x10E3/uL (ref 0.0–0.1)
Immature Granulocytes: 0 %
Lymphocytes Absolute: 3 x10E3/uL (ref 0.7–3.1)
Lymphs: 43 %
MCH: 26.2 pg — ABNORMAL LOW (ref 26.6–33.0)
MCHC: 30.9 g/dL — ABNORMAL LOW (ref 31.5–35.7)
MCV: 85 fL (ref 79–97)
Monocytes Absolute: 0.8 x10E3/uL (ref 0.1–0.9)
Monocytes: 12 %
Neutrophils Absolute: 3 x10E3/uL (ref 1.4–7.0)
Neutrophils: 42 %
Platelets: 504 x10E3/uL — ABNORMAL HIGH (ref 150–450)
RBC: 4.28 x10E6/uL (ref 3.77–5.28)
RDW: 14.7 % (ref 11.7–15.4)
WBC: 7.1 x10E3/uL (ref 3.4–10.8)

## 2024-02-28 LAB — THYROID PANEL WITH TSH
Free Thyroxine Index: 1.8 (ref 1.2–4.9)
T3 Uptake Ratio: 23 % — ABNORMAL LOW (ref 24–39)
T4, Total: 7.9 ug/dL (ref 4.5–12.0)
TSH: 2.68 u[IU]/mL (ref 0.450–4.500)

## 2024-02-28 LAB — IRON,TIBC AND FERRITIN PANEL
Ferritin: 49 ng/mL (ref 15–150)
Iron Saturation: 11 % — ABNORMAL LOW (ref 15–55)
Iron: 37 ug/dL (ref 27–159)
Total Iron Binding Capacity: 322 ug/dL (ref 250–450)
UIBC: 285 ug/dL (ref 131–425)

## 2024-02-28 LAB — VITAMIN D 25 HYDROXY (VIT D DEFICIENCY, FRACTURES): Vit D, 25-Hydroxy: 4.4 ng/mL — ABNORMAL LOW (ref 30.0–100.0)

## 2024-02-28 MED ORDER — VITAMIN D (ERGOCALCIFEROL) 1.25 MG (50000 UNIT) PO CAPS: 50000.0000 [IU] | ORAL_CAPSULE | ORAL | 1 refills | Status: AC

## 2024-03-07 ENCOUNTER — Ambulatory Visit: Payer: Self-pay | Admitting: *Deleted

## 2024-04-06 ENCOUNTER — Ambulatory Visit: Payer: Self-pay

## 2024-04-06 NOTE — Telephone Encounter (Signed)
 FYI

## 2024-04-06 NOTE — Telephone Encounter (Signed)
 FYI Only or Action Required?: Action required by provider: update on patient condition.  Patient was last seen in primary care on 02/27/2024 by Theotis Haze ORN, NP.  Called Nurse Triage reporting Tingling and Fatigue.  Symptoms began several months ago.  Interventions attempted: OTC medications: Vitamin D , 500mg  Tylenol .  Symptoms are: gradually worsening.  Triage Disposition: See PCP When Office is Open (Within 3 Days)  Patient/caregiver understands and will follow disposition?: Yes                         Message from Montie POUR sent at 04/06/2024  2:05 PM EST  Reason for Triage: She came in on 02/27/24 for Primary hypertension; Dx Fatigue  Nausea; Everything is getting worse; Also, she now has tingling in both hands   Reason for Disposition  [1] Numbness or tingling on both sides of body AND [2] is a new symptom present > 24 hours  Answer Assessment - Initial Assessment Questions Patient calling in for chronic symptoms, no new symptoms. Worsening for 2 weeks.    1. SYMPTOM: What is the main symptom you are concerned about? (e.g., weakness, numbness)     Tingling both hands (bilateral but alternates)   2. ONSET: When did this start? (e.g., minutes, hours, days; while sleeping)     x 1 week. After finishing triage, then patient states she has had tingling in her arms, hands and feet since December and was prescribed a cream.  3. LAST NORMAL: When was the last time you (the patient) were normal (no symptoms)?     N/A  4. PATTERN Does this come and go, or has it been constant since it started?  Is it present now?     Comes and goes, not present now.  5. CARDIAC SYMPTOMS: Have you had any of the following symptoms: chest pain, difficulty breathing, palpitations?     No.  6. NEUROLOGIC SYMPTOMS: Have you had any of the following symptoms: headache, dizziness, vision loss, double vision, changes in speech, unsteady on your feet?      Headache 2 days ago, not present now.  7. OTHER SYMPTOMS: Do you have any other symptoms?     Thinks she had a fever, did not check. Fatigue (able to stand and walk, moderate)/describes it more as not having the desire to do activity. Insomnia.Nausea.  No unilateral numbness or weakness, changes in speech or vision, urinary or stool incontinence, urinary retention.  8. PREGNANCY: Is there any chance you are pregnant? When was your last menstrual period?     OFE:azhpwwpwh of last month  Protocols used: Neurologic Deficit-A-AH
